# Patient Record
Sex: Female | Born: 1950
Health system: Southern US, Community
[De-identification: ages and names within clinical notes are randomized; demographics above are authoritative.]

## PROBLEM LIST (undated history)

## (undated) DIAGNOSIS — R112 Nausea with vomiting, unspecified: Secondary | ICD-10-CM

## (undated) DIAGNOSIS — J302 Other seasonal allergic rhinitis: Secondary | ICD-10-CM

## (undated) DIAGNOSIS — Z9889 Other specified postprocedural states: Secondary | ICD-10-CM

## (undated) DIAGNOSIS — N950 Postmenopausal bleeding: Secondary | ICD-10-CM

## (undated) DIAGNOSIS — E039 Hypothyroidism, unspecified: Secondary | ICD-10-CM

## (undated) DIAGNOSIS — R011 Cardiac murmur, unspecified: Secondary | ICD-10-CM

## (undated) DIAGNOSIS — E785 Hyperlipidemia, unspecified: Secondary | ICD-10-CM

## (undated) DIAGNOSIS — R600 Localized edema: Secondary | ICD-10-CM

## (undated) DIAGNOSIS — M1990 Unspecified osteoarthritis, unspecified site: Secondary | ICD-10-CM

## (undated) DIAGNOSIS — K219 Gastro-esophageal reflux disease without esophagitis: Secondary | ICD-10-CM

## (undated) DIAGNOSIS — I209 Angina pectoris, unspecified: Secondary | ICD-10-CM

## (undated) DIAGNOSIS — C439 Malignant melanoma of skin, unspecified: Secondary | ICD-10-CM

## (undated) HISTORY — DX: Gastro-esophageal reflux disease without esophagitis: K21.9

## (undated) HISTORY — DX: Hyperlipidemia, unspecified: E78.5

## (undated) HISTORY — DX: Hypothyroidism, unspecified: E03.9

## (undated) HISTORY — DX: Angina pectoris, unspecified: I20.9

## (undated) HISTORY — PX: COLONOSCOPY: SHX174

## (undated) HISTORY — DX: Other seasonal allergic rhinitis: J30.2

## (undated) HISTORY — DX: Malignant melanoma of skin, unspecified: C43.9

---

## 1973-12-30 HISTORY — PX: OTHER SURGICAL HISTORY: SHX169

## 1995-12-31 HISTORY — PX: BREAST LUMPECTOMY: SHX2

## 1997-12-30 DIAGNOSIS — C439 Malignant melanoma of skin, unspecified: Secondary | ICD-10-CM

## 1997-12-30 HISTORY — DX: Malignant melanoma of skin, unspecified: C43.9

## 1998-08-14 ENCOUNTER — Other Ambulatory Visit: Admission: RE | Admit: 1998-08-14 | Discharge: 1998-08-14 | Payer: Self-pay | Admitting: Obstetrics & Gynecology

## 1998-09-14 ENCOUNTER — Other Ambulatory Visit: Admission: RE | Admit: 1998-09-14 | Discharge: 1998-09-14 | Payer: Self-pay | Admitting: Obstetrics & Gynecology

## 1999-03-23 ENCOUNTER — Other Ambulatory Visit: Admission: RE | Admit: 1999-03-23 | Discharge: 1999-03-23 | Payer: Self-pay | Admitting: Obstetrics & Gynecology

## 1999-10-04 ENCOUNTER — Other Ambulatory Visit: Admission: RE | Admit: 1999-10-04 | Discharge: 1999-10-04 | Payer: Self-pay | Admitting: Obstetrics & Gynecology

## 2000-10-23 ENCOUNTER — Other Ambulatory Visit: Admission: RE | Admit: 2000-10-23 | Discharge: 2000-10-23 | Payer: Self-pay | Admitting: Obstetrics & Gynecology

## 2001-10-28 ENCOUNTER — Other Ambulatory Visit: Admission: RE | Admit: 2001-10-28 | Discharge: 2001-10-28 | Payer: Self-pay | Admitting: Obstetrics & Gynecology

## 2002-10-29 ENCOUNTER — Other Ambulatory Visit: Admission: RE | Admit: 2002-10-29 | Discharge: 2002-10-29 | Payer: Self-pay | Admitting: Obstetrics and Gynecology

## 2003-11-29 ENCOUNTER — Other Ambulatory Visit: Admission: RE | Admit: 2003-11-29 | Discharge: 2003-11-29 | Payer: Self-pay | Admitting: Obstetrics & Gynecology

## 2005-01-04 ENCOUNTER — Other Ambulatory Visit: Admission: RE | Admit: 2005-01-04 | Discharge: 2005-01-04 | Payer: Self-pay | Admitting: Obstetrics & Gynecology

## 2005-12-30 HISTORY — PX: COLONOSCOPY: SHX174

## 2006-01-14 ENCOUNTER — Other Ambulatory Visit: Admission: RE | Admit: 2006-01-14 | Discharge: 2006-01-14 | Payer: Self-pay | Admitting: Obstetrics & Gynecology

## 2006-12-05 ENCOUNTER — Ambulatory Visit: Payer: Self-pay | Admitting: Gastroenterology

## 2006-12-15 ENCOUNTER — Ambulatory Visit: Payer: Self-pay | Admitting: Gastroenterology

## 2006-12-15 ENCOUNTER — Encounter (INDEPENDENT_AMBULATORY_CARE_PROVIDER_SITE_OTHER): Payer: Self-pay | Admitting: *Deleted

## 2007-09-28 ENCOUNTER — Encounter: Admission: RE | Admit: 2007-09-28 | Discharge: 2007-09-28 | Payer: Self-pay | Admitting: Orthopaedic Surgery

## 2010-05-31 ENCOUNTER — Other Ambulatory Visit: Admission: RE | Admit: 2010-05-31 | Discharge: 2010-05-31 | Payer: Self-pay | Admitting: Radiology

## 2011-05-03 ENCOUNTER — Other Ambulatory Visit (HOSPITAL_COMMUNITY): Payer: Self-pay | Admitting: Orthopedic Surgery

## 2011-05-03 DIAGNOSIS — E041 Nontoxic single thyroid nodule: Secondary | ICD-10-CM

## 2011-05-07 ENCOUNTER — Ambulatory Visit (HOSPITAL_COMMUNITY)
Admission: RE | Admit: 2011-05-07 | Discharge: 2011-05-07 | Disposition: A | Discharge status: Home / Self Care | Payer: PRIVATE HEALTH INSURANCE | Source: Ambulatory Visit | Attending: Orthopedic Surgery | Admitting: Orthopedic Surgery

## 2011-05-07 DIAGNOSIS — E049 Nontoxic goiter, unspecified: Secondary | ICD-10-CM | POA: Insufficient documentation

## 2011-05-07 DIAGNOSIS — E041 Nontoxic single thyroid nodule: Secondary | ICD-10-CM

## 2011-05-14 ENCOUNTER — Encounter (INDEPENDENT_AMBULATORY_CARE_PROVIDER_SITE_OTHER): Payer: Self-pay | Admitting: Surgery

## 2011-05-31 ENCOUNTER — Other Ambulatory Visit (HOSPITAL_COMMUNITY): Payer: Self-pay | Admitting: Surgery

## 2011-05-31 DIAGNOSIS — E041 Nontoxic single thyroid nodule: Secondary | ICD-10-CM

## 2011-06-05 ENCOUNTER — Other Ambulatory Visit: Payer: Self-pay | Admitting: Interventional Radiology

## 2011-06-05 ENCOUNTER — Ambulatory Visit (HOSPITAL_COMMUNITY)
Admission: RE | Admit: 2011-06-05 | Discharge: 2011-06-05 | Disposition: A | Discharge status: Home / Self Care | Payer: PRIVATE HEALTH INSURANCE | Source: Ambulatory Visit | Attending: Surgery | Admitting: Surgery

## 2011-06-05 DIAGNOSIS — E041 Nontoxic single thyroid nodule: Secondary | ICD-10-CM

## 2011-06-05 DIAGNOSIS — E042 Nontoxic multinodular goiter: Secondary | ICD-10-CM | POA: Insufficient documentation

## 2011-11-04 ENCOUNTER — Other Ambulatory Visit (HOSPITAL_COMMUNITY): Payer: Self-pay | Admitting: Endocrinology

## 2011-11-04 DIAGNOSIS — E041 Nontoxic single thyroid nodule: Secondary | ICD-10-CM

## 2011-11-14 ENCOUNTER — Ambulatory Visit (HOSPITAL_COMMUNITY)
Admission: RE | Admit: 2011-11-14 | Discharge: 2011-11-14 | Disposition: A | Discharge status: Home / Self Care | Payer: PRIVATE HEALTH INSURANCE | Source: Ambulatory Visit | Attending: Endocrinology | Admitting: Endocrinology

## 2011-11-14 DIAGNOSIS — E042 Nontoxic multinodular goiter: Secondary | ICD-10-CM | POA: Insufficient documentation

## 2011-11-14 DIAGNOSIS — E041 Nontoxic single thyroid nodule: Secondary | ICD-10-CM

## 2011-11-15 ENCOUNTER — Encounter: Payer: Self-pay | Admitting: Gastroenterology

## 2011-12-04 ENCOUNTER — Ambulatory Visit (AMBULATORY_SURGERY_CENTER): Payer: PRIVATE HEALTH INSURANCE | Admitting: *Deleted

## 2011-12-04 ENCOUNTER — Encounter: Payer: Self-pay | Admitting: Gastroenterology

## 2011-12-04 VITALS — Ht 65.5 in | Wt 176.4 lb

## 2011-12-04 DIAGNOSIS — Z1211 Encounter for screening for malignant neoplasm of colon: Secondary | ICD-10-CM

## 2011-12-04 MED ORDER — PEG-KCL-NACL-NASULF-NA ASC-C 100 G PO SOLR: ORAL | Status: DC

## 2011-12-18 ENCOUNTER — Encounter: Payer: Self-pay | Admitting: Gastroenterology

## 2011-12-18 ENCOUNTER — Ambulatory Visit (AMBULATORY_SURGERY_CENTER): Payer: PRIVATE HEALTH INSURANCE | Admitting: Gastroenterology

## 2011-12-18 VITALS — BP 121/77 | HR 74 | Temp 98.9°F | Resp 20 | Ht 65.0 in | Wt 176.0 lb

## 2011-12-18 DIAGNOSIS — D129 Benign neoplasm of anus and anal canal: Secondary | ICD-10-CM

## 2011-12-18 DIAGNOSIS — Z1211 Encounter for screening for malignant neoplasm of colon: Secondary | ICD-10-CM

## 2011-12-18 DIAGNOSIS — Z860100 Personal history of colon polyps, unspecified: Secondary | ICD-10-CM | POA: Insufficient documentation

## 2011-12-18 DIAGNOSIS — D128 Benign neoplasm of rectum: Secondary | ICD-10-CM

## 2011-12-18 DIAGNOSIS — D126 Benign neoplasm of colon, unspecified: Secondary | ICD-10-CM

## 2011-12-18 MED ORDER — SODIUM CHLORIDE 0.9 % IV SOLN: 500.0000 mL | INTRAVENOUS | Status: DC

## 2011-12-18 NOTE — Progress Notes (Signed)
No complaints voiced in the recovery room. Maw  Patient did not have preoperative order for IV antibiotic SSI prophylaxis. [phone]) Patient did not experience any of the following events: a burn prior to discharge; a fall within the facility; wrong site/side/patient/procedure/implant event; or a hospital transfer or hospital admission upon discharge from the facility. [phone])   Pt requested to go to the restroom before her 30 mins were up.  I offered the bed pan to her.  I firsted cleaned her back side with baby wipes and then she was put on the bedpan.  No stool was present, just tan colored liquid.  MAW

## 2011-12-18 NOTE — Op Note (Signed)
[facility] [address] [place], [place]  [address]  COLONOSCOPY PROCEDURE REPORT  PATIENT:  Debra Patterson, Debra Patterson  MR#:  [id number] BIRTHDATE:  15-Feb-1951, 60 yrs. old  GENDER:  female ENDOSCOPIST:  Vania Rea. Jarold Motto, MD, [facility] REF. BY: PROCEDURE DATE:  12/18/2011 PROCEDURE:  Colonoscopy with biopsy ASA CLASS:  Class II INDICATIONS:  history of pre-cancerous (adenomatous) colon polyps  MEDICATIONS:   propofol (Diprivan) 280 mg IV  DESCRIPTION OF PROCEDURE:   After the risks and benefits and of the procedure were explained, informed consent was obtained. Digital rectal exam was performed and revealed no abnormalities. The LB 180AL [id number] endoscope was introduced through the anus and advanced to the cecum, which was identified by both the appendix and ileocecal valve.  The quality of the prep was excellent, using MoviPrep.  The instrument was then slowly withdrawn as the colon was fully examined. <<PROCEDUREIMAGES>>  FINDINGS:  A sessile polyp was found in the rectum. RECTAL POLYP COLD BIOPSY REMOVED  This was otherwise a normal examination of the colon.   Retroflexed views in the rectum revealed no abnormalities.    The scope was then withdrawn from the patient and the procedure completed.  COMPLICATIONS:  None ENDOSCOPIC IMPRESSION: 1) Sessile polyp in the rectum 2) Otherwise normal examination R/O ADENOMA. RECOMMENDATIONS: 1) Repeat colonoscopy in 5 years if polyp adenomatous; otherwise 10 years  REPEAT EXAM:  No  ______________________________ Vania Rea. Jarold Motto, MD, [facility]  CC:  Adrian Prince, MD  n. Rosalie DoctorMarland Kitchen   Vania Rea. Nicol Herbig at 12/18/2011 10:52 AM  Vara Guardian, [phone]

## 2011-12-18 NOTE — Patient Instructions (Signed)
See the picture page for your findings from your exam today.  Follow the green and blue discharge instruction sheets the rest of the day.  Resume your prior medications today. Please call if any questions or concerns.  

## 2011-12-19 ENCOUNTER — Telehealth: Payer: Self-pay

## 2011-12-19 NOTE — Telephone Encounter (Signed)

## 2012-01-01 ENCOUNTER — Encounter: Payer: Self-pay | Admitting: Gastroenterology

## 2012-06-30 ENCOUNTER — Other Ambulatory Visit: Payer: Self-pay | Admitting: Obstetrics & Gynecology

## 2012-07-09 ENCOUNTER — Encounter (INDEPENDENT_AMBULATORY_CARE_PROVIDER_SITE_OTHER): Payer: Self-pay

## 2014-03-23 ENCOUNTER — Other Ambulatory Visit (HOSPITAL_COMMUNITY): Payer: Self-pay | Admitting: Endocrinology

## 2014-03-23 DIAGNOSIS — E049 Nontoxic goiter, unspecified: Secondary | ICD-10-CM

## 2014-03-25 ENCOUNTER — Ambulatory Visit (HOSPITAL_COMMUNITY)
Admission: RE | Admit: 2014-03-25 | Discharge: 2014-03-25 | Disposition: A | Discharge status: Home / Self Care | Payer: PRIVATE HEALTH INSURANCE | Source: Ambulatory Visit | Attending: Endocrinology | Admitting: Endocrinology

## 2014-03-25 DIAGNOSIS — E049 Nontoxic goiter, unspecified: Secondary | ICD-10-CM

## 2014-03-25 DIAGNOSIS — E042 Nontoxic multinodular goiter: Secondary | ICD-10-CM | POA: Insufficient documentation

## 2014-09-30 ENCOUNTER — Encounter: Payer: Self-pay | Admitting: Gastroenterology

## 2015-03-13 ENCOUNTER — Other Ambulatory Visit: Payer: Self-pay | Admitting: Obstetrics & Gynecology

## 2015-03-13 DIAGNOSIS — E2839 Other primary ovarian failure: Secondary | ICD-10-CM

## 2015-03-14 LAB — CYTOLOGY - PAP

## 2015-03-27 ENCOUNTER — Ambulatory Visit
Admission: RE | Admit: 2015-03-27 | Discharge: 2015-03-27 | Disposition: A | Discharge status: Home / Self Care | Payer: PPO | Source: Ambulatory Visit | Attending: Obstetrics & Gynecology | Admitting: Obstetrics & Gynecology

## 2015-03-27 DIAGNOSIS — E2839 Other primary ovarian failure: Secondary | ICD-10-CM

## 2015-03-28 ENCOUNTER — Other Ambulatory Visit: Payer: PRIVATE HEALTH INSURANCE

## 2015-09-11 ENCOUNTER — Ambulatory Visit (INDEPENDENT_AMBULATORY_CARE_PROVIDER_SITE_OTHER): Payer: PPO | Admitting: Podiatry

## 2015-09-11 ENCOUNTER — Ambulatory Visit (INDEPENDENT_AMBULATORY_CARE_PROVIDER_SITE_OTHER): Payer: PPO

## 2015-09-11 ENCOUNTER — Ambulatory Visit: Payer: Self-pay

## 2015-09-11 ENCOUNTER — Encounter: Payer: Self-pay | Admitting: Podiatry

## 2015-09-11 VITALS — BP 128/71 | HR 64 | Temp 99.3°F | Resp 14

## 2015-09-11 DIAGNOSIS — R52 Pain, unspecified: Secondary | ICD-10-CM

## 2015-09-11 DIAGNOSIS — M7730 Calcaneal spur, unspecified foot: Secondary | ICD-10-CM

## 2015-09-11 DIAGNOSIS — M722 Plantar fascial fibromatosis: Secondary | ICD-10-CM

## 2015-09-11 NOTE — Progress Notes (Signed)
   Subjective:    Patient ID: Debra Patterson, female    DOB: [birth year], 64 y.o.   MRN: [id number]  HPI Patient presents here today with right back of heel pain which has a knot there for several weeks.  This patient presents to office with painful knot on the back of her right heel.  She says there is pain wearing shoes and walking.  She denies trauma or injury.  There is hard mass noted on the back of the heel bone at insertion achilles tendon right foot.  Patient has history of plantar fascitis which was successfully treated with injection therapy.  She says she cannot take NSAIDs or cortisone by mouth.  She desires evaluation and treatment.  Review of Systems  All other systems reviewed and are negative.      Objective:   Physical Exam GENERAL APPEARANCE: Alert, conversant. Appropriately groomed. No acute distress.  VASCULAR: Pedal pulses palpable at  [facility] and PT bilateral.  Capillary refill time is immediate to all digits,  Normal temperature gradient.  Digital hair growth is present bilateral  NEUROLOGIC: sensation is normal to 5.07 monofilament at 5/5 sites bilateral.  Light touch is intact bilateral, Muscle strength normal.  MUSCULOSKELETAL: acceptable muscle strength, tone and stability bilateral.  Intrinsic muscluature intact bilateral.  Rectus appearance of foot and digits noted bilateral. There is bony exostosis retrocalcaneally right.  No evidence of redness or swelling or inflammation.  DERMATOLOGIC: skin color, texture, and turgor are within normal limits.  No preulcerative lesions or ulcers  are seen, no interdigital maceration noted.  No open lesions present.  Digital nails are asymptomatic. No drainage noted.        Assessment & Plan:  Bone Spur  Right  IE  X-ray.  Due to her allergies I recommended insole usage.  To check with other treatment since she does not want medicine po or immobilization as form of treatment.  To call her tomorrow with recommendations.

## 2015-09-12 NOTE — Addendum Note (Signed)
Addended by: Ezzard Flax, Merric Yost L on: 09/12/2015 08:35 AM   Modules accepted: Orders

## 2015-09-13 ENCOUNTER — Ambulatory Visit (INDEPENDENT_AMBULATORY_CARE_PROVIDER_SITE_OTHER): Payer: PPO | Admitting: Podiatry

## 2015-09-13 ENCOUNTER — Encounter: Payer: Self-pay | Admitting: Podiatry

## 2015-09-13 DIAGNOSIS — M7730 Calcaneal spur, unspecified foot: Secondary | ICD-10-CM

## 2015-09-13 NOTE — Progress Notes (Addendum)
Patient ID: Debra Patterson, female   DOB: Dec 06, 1951, 64 y.o.   MRN: [id number] IPatient presents here today with right back of heel pain which has a knot there for several weeks.  This patient presents to office with painful knot on the back of her right heel. She says there is pain wearing shoes and walking. She denies trauma or injury. There is hard mass noted on the back of the heel bone at insertion achilles tendon right foot. Patient has history of plantar fascitis which was successfully treated with injection therapy. She says she cannot take NSAIDs or cortisone by mouth. She desires evaluation and treatment.  Review of Systems  All other systems reviewed and are negative am back to see Dr Prudence Davidson to get the injection in my foot  Objective     Physical Exam GENERAL APPEARANCE: Alert, conversant. Appropriately groomed. No acute distress.  VASCULAR: Pedal pulses palpable at [facility] and PT bilateral. Capillary refill time is immediate to all digits, Normal temperature gradient. Digital hair growth is present bilateral  NEUROLOGIC: sensation is normal to 5.07 monofilament at 5/5 sites bilateral. Light touch is intact bilateral, Muscle strength normal.  MUSCULOSKELETAL: acceptable muscle strength, tone and stability bilateral. Intrinsic muscluature intact bilateral. Rectus appearance of foot and digits noted bilateral. There is bony exostosis retrocalcaneally right. No evidence of redness or swelling or inflammation.  DERMATOLOGIC: skin color, texture, and turgor are within normal limits. No preulcerative lesions or ulcers are seen, no interdigital maceration noted. No open lesions present. Digital nails are asymptomatic. No drainage noted.        Assessment & Plan:  Bone Spur Right  Injection therapy.  Told her to discontinue activity until her vacation.  Told her to wear night splint in PM. Call as needed. Injection was directed from lateral position.

## 2015-11-29 ENCOUNTER — Ambulatory Visit (INDEPENDENT_AMBULATORY_CARE_PROVIDER_SITE_OTHER): Payer: PPO | Admitting: Podiatry

## 2015-11-29 ENCOUNTER — Encounter: Payer: Self-pay | Admitting: Podiatry

## 2015-11-29 VITALS — BP 145/78 | HR 75 | Resp 14

## 2015-11-29 DIAGNOSIS — M7730 Calcaneal spur, unspecified foot: Secondary | ICD-10-CM

## 2015-11-29 NOTE — Progress Notes (Signed)
Subjective:     Patient ID: Debra Patterson, female   DOB: 01/30/1951, 64 y.o.   MRN: [id number]  HPI this patient returns to the office with a diagnosis of a retrocalcaneal bursitis, right foot. She states she received an injection by myself approximately 2 months ago. She states she is was pain free until last week when the pain has started to return. She states that there has been an increase in the swelling at the back of the right heel. She points to one area that is the most extreme pain in her heel. She requests continued evaluation and treatment of this condition. 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 Appropriately groomed. No acute distress.  VASCULAR: Pedal pulses palpable at  [facility] and PT bilateral.  Capillary refill time is immediate to all digits,  Normal temperature gradient.  Digital hair growth is present bilateral  NEUROLOGIC: sensation is normal to 5.07 monofilament at 5/5 sites bilateral.  Light touch is intact bilateral, Muscle strength normal.  MUSCULOSKELETAL: acceptable muscle strength, tone and stability bilateral.  Intrinsic muscluature intact bilateral.  Rectus appearance of foot and digits noted bilateral. Pain and redness and increased swelling at the insertion achilles tendon right foot.   DERMATOLOGIC: skin color, texture, and turgor are within normal limits.  No preulcerative lesions or ulcers  are seen, no interdigital maceration noted.  No open lesions present.  Digital nails are asymptomatic. No drainage noted.     Assessment:     Retrocalcaneal exostosis Right     Plan:     Injection therapy.  She was given a second injection directed laterally from achilles tendon insertion.  She is unable to take NSAIDs.  I told her that if this persists, I will no longer provide her with injections but I will  request she be evaluated by surgeons at [place]. RTC prn

## 2016-02-05 DIAGNOSIS — K5900 Constipation, unspecified: Secondary | ICD-10-CM | POA: Diagnosis not present

## 2016-02-05 DIAGNOSIS — R1011 Right upper quadrant pain: Secondary | ICD-10-CM | POA: Diagnosis not present

## 2016-02-05 DIAGNOSIS — K219 Gastro-esophageal reflux disease without esophagitis: Secondary | ICD-10-CM | POA: Diagnosis not present

## 2016-02-13 DIAGNOSIS — N63 Unspecified lump in breast: Secondary | ICD-10-CM | POA: Diagnosis not present

## 2016-02-16 DIAGNOSIS — Z6829 Body mass index (BMI) 29.0-29.9, adult: Secondary | ICD-10-CM | POA: Diagnosis not present

## 2016-02-16 DIAGNOSIS — I10 Essential (primary) hypertension: Secondary | ICD-10-CM | POA: Diagnosis not present

## 2016-02-16 DIAGNOSIS — R05 Cough: Secondary | ICD-10-CM | POA: Diagnosis not present

## 2016-02-16 DIAGNOSIS — R509 Fever, unspecified: Secondary | ICD-10-CM | POA: Diagnosis not present

## 2016-02-16 DIAGNOSIS — J029 Acute pharyngitis, unspecified: Secondary | ICD-10-CM | POA: Diagnosis not present

## 2016-03-25 DIAGNOSIS — E784 Other hyperlipidemia: Secondary | ICD-10-CM | POA: Diagnosis not present

## 2016-03-25 DIAGNOSIS — D126 Benign neoplasm of colon, unspecified: Secondary | ICD-10-CM | POA: Diagnosis not present

## 2016-03-25 DIAGNOSIS — C439 Malignant melanoma of skin, unspecified: Secondary | ICD-10-CM | POA: Diagnosis not present

## 2016-03-25 DIAGNOSIS — E042 Nontoxic multinodular goiter: Secondary | ICD-10-CM | POA: Diagnosis not present

## 2016-03-25 DIAGNOSIS — Z6829 Body mass index (BMI) 29.0-29.9, adult: Secondary | ICD-10-CM | POA: Diagnosis not present

## 2016-03-25 DIAGNOSIS — E559 Vitamin D deficiency, unspecified: Secondary | ICD-10-CM | POA: Diagnosis not present

## 2016-03-25 DIAGNOSIS — Z1389 Encounter for screening for other disorder: Secondary | ICD-10-CM | POA: Diagnosis not present

## 2016-03-25 DIAGNOSIS — I10 Essential (primary) hypertension: Secondary | ICD-10-CM | POA: Diagnosis not present

## 2016-04-01 DIAGNOSIS — Z01419 Encounter for gynecological examination (general) (routine) without abnormal findings: Secondary | ICD-10-CM | POA: Diagnosis not present

## 2016-04-01 DIAGNOSIS — Z6828 Body mass index (BMI) 28.0-28.9, adult: Secondary | ICD-10-CM | POA: Diagnosis not present

## 2016-04-04 DIAGNOSIS — L820 Inflamed seborrheic keratosis: Secondary | ICD-10-CM | POA: Diagnosis not present

## 2016-04-26 DIAGNOSIS — M5412 Radiculopathy, cervical region: Secondary | ICD-10-CM | POA: Diagnosis not present

## 2016-04-30 DIAGNOSIS — M47892 Other spondylosis, cervical region: Secondary | ICD-10-CM | POA: Diagnosis not present

## 2016-04-30 DIAGNOSIS — R208 Other disturbances of skin sensation: Secondary | ICD-10-CM | POA: Diagnosis not present

## 2016-04-30 DIAGNOSIS — M5412 Radiculopathy, cervical region: Secondary | ICD-10-CM | POA: Diagnosis not present

## 2016-05-01 DIAGNOSIS — M5412 Radiculopathy, cervical region: Secondary | ICD-10-CM | POA: Diagnosis not present

## 2016-05-03 DIAGNOSIS — M50323 Other cervical disc degeneration at C6-C7 level: Secondary | ICD-10-CM | POA: Diagnosis not present

## 2016-05-03 DIAGNOSIS — M5030 Other cervical disc degeneration, unspecified cervical region: Secondary | ICD-10-CM | POA: Diagnosis not present

## 2016-05-03 DIAGNOSIS — M50322 Other cervical disc degeneration at C5-C6 level: Secondary | ICD-10-CM | POA: Diagnosis not present

## 2016-05-03 DIAGNOSIS — M5412 Radiculopathy, cervical region: Secondary | ICD-10-CM | POA: Diagnosis not present

## 2016-05-13 DIAGNOSIS — M545 Low back pain: Secondary | ICD-10-CM | POA: Diagnosis not present

## 2016-05-13 DIAGNOSIS — R109 Unspecified abdominal pain: Secondary | ICD-10-CM | POA: Diagnosis not present

## 2016-05-17 DIAGNOSIS — M50322 Other cervical disc degeneration at C5-C6 level: Secondary | ICD-10-CM | POA: Diagnosis not present

## 2016-05-17 DIAGNOSIS — M5412 Radiculopathy, cervical region: Secondary | ICD-10-CM | POA: Diagnosis not present

## 2016-05-17 DIAGNOSIS — M50122 Cervical disc disorder at C5-C6 level with radiculopathy: Secondary | ICD-10-CM | POA: Diagnosis not present

## 2016-06-28 DIAGNOSIS — M5412 Radiculopathy, cervical region: Secondary | ICD-10-CM | POA: Diagnosis not present

## 2016-07-08 DIAGNOSIS — H2513 Age-related nuclear cataract, bilateral: Secondary | ICD-10-CM | POA: Diagnosis not present

## 2016-08-20 DIAGNOSIS — N63 Unspecified lump in breast: Secondary | ICD-10-CM | POA: Diagnosis not present

## 2016-10-01 DIAGNOSIS — M5412 Radiculopathy, cervical region: Secondary | ICD-10-CM | POA: Diagnosis not present

## 2016-10-16 DIAGNOSIS — Z6830 Body mass index (BMI) 30.0-30.9, adult: Secondary | ICD-10-CM | POA: Diagnosis not present

## 2016-10-16 DIAGNOSIS — I10 Essential (primary) hypertension: Secondary | ICD-10-CM | POA: Diagnosis not present

## 2016-10-16 DIAGNOSIS — C439 Malignant melanoma of skin, unspecified: Secondary | ICD-10-CM | POA: Diagnosis not present

## 2016-10-16 DIAGNOSIS — E042 Nontoxic multinodular goiter: Secondary | ICD-10-CM | POA: Diagnosis not present

## 2016-10-16 DIAGNOSIS — D126 Benign neoplasm of colon, unspecified: Secondary | ICD-10-CM | POA: Diagnosis not present

## 2016-10-16 DIAGNOSIS — E784 Other hyperlipidemia: Secondary | ICD-10-CM | POA: Diagnosis not present

## 2016-10-16 DIAGNOSIS — Z23 Encounter for immunization: Secondary | ICD-10-CM | POA: Diagnosis not present

## 2016-10-16 DIAGNOSIS — E559 Vitamin D deficiency, unspecified: Secondary | ICD-10-CM | POA: Diagnosis not present

## 2016-12-12 DIAGNOSIS — Z85828 Personal history of other malignant neoplasm of skin: Secondary | ICD-10-CM | POA: Diagnosis not present

## 2016-12-12 DIAGNOSIS — Z85820 Personal history of malignant melanoma of skin: Secondary | ICD-10-CM | POA: Diagnosis not present

## 2016-12-12 DIAGNOSIS — D2372 Other benign neoplasm of skin of left lower limb, including hip: Secondary | ICD-10-CM | POA: Diagnosis not present

## 2016-12-12 DIAGNOSIS — D1801 Hemangioma of skin and subcutaneous tissue: Secondary | ICD-10-CM | POA: Diagnosis not present

## 2016-12-12 DIAGNOSIS — D485 Neoplasm of uncertain behavior of skin: Secondary | ICD-10-CM | POA: Diagnosis not present

## 2016-12-12 DIAGNOSIS — L814 Other melanin hyperpigmentation: Secondary | ICD-10-CM | POA: Diagnosis not present

## 2016-12-12 DIAGNOSIS — D225 Melanocytic nevi of trunk: Secondary | ICD-10-CM | POA: Diagnosis not present

## 2016-12-12 DIAGNOSIS — D224 Melanocytic nevi of scalp and neck: Secondary | ICD-10-CM | POA: Diagnosis not present

## 2016-12-12 DIAGNOSIS — L738 Other specified follicular disorders: Secondary | ICD-10-CM | POA: Diagnosis not present

## 2016-12-12 DIAGNOSIS — D2271 Melanocytic nevi of right lower limb, including hip: Secondary | ICD-10-CM | POA: Diagnosis not present

## 2016-12-12 DIAGNOSIS — L821 Other seborrheic keratosis: Secondary | ICD-10-CM | POA: Diagnosis not present

## 2016-12-26 DIAGNOSIS — D485 Neoplasm of uncertain behavior of skin: Secondary | ICD-10-CM | POA: Diagnosis not present

## 2016-12-26 DIAGNOSIS — Z85828 Personal history of other malignant neoplasm of skin: Secondary | ICD-10-CM | POA: Diagnosis not present

## 2016-12-26 DIAGNOSIS — Z85820 Personal history of malignant melanoma of skin: Secondary | ICD-10-CM | POA: Diagnosis not present

## 2016-12-26 DIAGNOSIS — L98499 Non-pressure chronic ulcer of skin of other sites with unspecified severity: Secondary | ICD-10-CM | POA: Diagnosis not present

## 2017-01-01 DIAGNOSIS — M5412 Radiculopathy, cervical region: Secondary | ICD-10-CM | POA: Diagnosis not present

## 2017-01-07 DIAGNOSIS — Z85820 Personal history of malignant melanoma of skin: Secondary | ICD-10-CM | POA: Diagnosis not present

## 2017-01-07 DIAGNOSIS — L814 Other melanin hyperpigmentation: Secondary | ICD-10-CM | POA: Diagnosis not present

## 2017-01-07 DIAGNOSIS — D485 Neoplasm of uncertain behavior of skin: Secondary | ICD-10-CM | POA: Diagnosis not present

## 2017-01-07 DIAGNOSIS — L738 Other specified follicular disorders: Secondary | ICD-10-CM | POA: Diagnosis not present

## 2017-01-07 DIAGNOSIS — I788 Other diseases of capillaries: Secondary | ICD-10-CM | POA: Diagnosis not present

## 2017-01-07 DIAGNOSIS — Z85828 Personal history of other malignant neoplasm of skin: Secondary | ICD-10-CM | POA: Diagnosis not present

## 2017-01-07 DIAGNOSIS — Z419 Encounter for procedure for purposes other than remedying health state, unspecified: Secondary | ICD-10-CM | POA: Diagnosis not present

## 2017-04-14 DIAGNOSIS — E559 Vitamin D deficiency, unspecified: Secondary | ICD-10-CM | POA: Diagnosis not present

## 2017-04-14 DIAGNOSIS — Z6830 Body mass index (BMI) 30.0-30.9, adult: Secondary | ICD-10-CM | POA: Diagnosis not present

## 2017-04-14 DIAGNOSIS — I10 Essential (primary) hypertension: Secondary | ICD-10-CM | POA: Diagnosis not present

## 2017-04-14 DIAGNOSIS — D126 Benign neoplasm of colon, unspecified: Secondary | ICD-10-CM | POA: Diagnosis not present

## 2017-04-14 DIAGNOSIS — E042 Nontoxic multinodular goiter: Secondary | ICD-10-CM | POA: Diagnosis not present

## 2017-04-14 DIAGNOSIS — K760 Fatty (change of) liver, not elsewhere classified: Secondary | ICD-10-CM | POA: Diagnosis not present

## 2017-04-14 DIAGNOSIS — E784 Other hyperlipidemia: Secondary | ICD-10-CM | POA: Diagnosis not present

## 2017-05-22 DIAGNOSIS — N3090 Cystitis, unspecified without hematuria: Secondary | ICD-10-CM | POA: Diagnosis not present

## 2017-05-22 DIAGNOSIS — N3001 Acute cystitis with hematuria: Secondary | ICD-10-CM | POA: Diagnosis not present

## 2017-05-22 DIAGNOSIS — R300 Dysuria: Secondary | ICD-10-CM | POA: Diagnosis not present

## 2017-06-09 ENCOUNTER — Other Ambulatory Visit: Payer: Self-pay | Admitting: Obstetrics & Gynecology

## 2017-06-09 DIAGNOSIS — Z6830 Body mass index (BMI) 30.0-30.9, adult: Secondary | ICD-10-CM | POA: Diagnosis not present

## 2017-06-09 DIAGNOSIS — Z01411 Encounter for gynecological examination (general) (routine) with abnormal findings: Secondary | ICD-10-CM | POA: Diagnosis not present

## 2017-06-09 DIAGNOSIS — N840 Polyp of corpus uteri: Secondary | ICD-10-CM | POA: Diagnosis not present

## 2017-06-09 DIAGNOSIS — Z124 Encounter for screening for malignant neoplasm of cervix: Secondary | ICD-10-CM | POA: Diagnosis not present

## 2017-06-09 DIAGNOSIS — N950 Postmenopausal bleeding: Secondary | ICD-10-CM | POA: Diagnosis not present

## 2017-06-13 LAB — CYTOLOGY - PAP

## 2017-07-07 DIAGNOSIS — H2513 Age-related nuclear cataract, bilateral: Secondary | ICD-10-CM | POA: Diagnosis not present

## 2017-08-21 DIAGNOSIS — Z1231 Encounter for screening mammogram for malignant neoplasm of breast: Secondary | ICD-10-CM | POA: Diagnosis not present

## 2017-08-21 DIAGNOSIS — Z803 Family history of malignant neoplasm of breast: Secondary | ICD-10-CM | POA: Diagnosis not present

## 2017-08-26 DIAGNOSIS — N6002 Solitary cyst of left breast: Secondary | ICD-10-CM | POA: Diagnosis not present

## 2017-09-03 DIAGNOSIS — K760 Fatty (change of) liver, not elsewhere classified: Secondary | ICD-10-CM | POA: Diagnosis not present

## 2017-09-03 DIAGNOSIS — K219 Gastro-esophageal reflux disease without esophagitis: Secondary | ICD-10-CM | POA: Diagnosis not present

## 2017-09-09 DIAGNOSIS — Z85828 Personal history of other malignant neoplasm of skin: Secondary | ICD-10-CM | POA: Diagnosis not present

## 2017-09-09 DIAGNOSIS — M1990 Unspecified osteoarthritis, unspecified site: Secondary | ICD-10-CM | POA: Diagnosis not present

## 2017-09-09 DIAGNOSIS — Z79899 Other long term (current) drug therapy: Secondary | ICD-10-CM | POA: Diagnosis not present

## 2017-09-09 DIAGNOSIS — Z86010 Personal history of colonic polyps: Secondary | ICD-10-CM | POA: Diagnosis not present

## 2017-09-09 DIAGNOSIS — Z1211 Encounter for screening for malignant neoplasm of colon: Secondary | ICD-10-CM | POA: Diagnosis not present

## 2017-09-09 DIAGNOSIS — K449 Diaphragmatic hernia without obstruction or gangrene: Secondary | ICD-10-CM | POA: Diagnosis not present

## 2017-09-09 DIAGNOSIS — K219 Gastro-esophageal reflux disease without esophagitis: Secondary | ICD-10-CM | POA: Diagnosis not present

## 2017-09-16 DIAGNOSIS — L814 Other melanin hyperpigmentation: Secondary | ICD-10-CM | POA: Diagnosis not present

## 2017-09-16 DIAGNOSIS — Z85820 Personal history of malignant melanoma of skin: Secondary | ICD-10-CM | POA: Diagnosis not present

## 2017-09-16 DIAGNOSIS — L821 Other seborrheic keratosis: Secondary | ICD-10-CM | POA: Diagnosis not present

## 2017-09-16 DIAGNOSIS — D1801 Hemangioma of skin and subcutaneous tissue: Secondary | ICD-10-CM | POA: Diagnosis not present

## 2017-09-16 DIAGNOSIS — L820 Inflamed seborrheic keratosis: Secondary | ICD-10-CM | POA: Diagnosis not present

## 2017-09-16 DIAGNOSIS — Z85828 Personal history of other malignant neoplasm of skin: Secondary | ICD-10-CM | POA: Diagnosis not present

## 2017-10-20 DIAGNOSIS — Z6829 Body mass index (BMI) 29.0-29.9, adult: Secondary | ICD-10-CM | POA: Diagnosis not present

## 2017-10-20 DIAGNOSIS — K760 Fatty (change of) liver, not elsewhere classified: Secondary | ICD-10-CM | POA: Diagnosis not present

## 2017-10-20 DIAGNOSIS — E559 Vitamin D deficiency, unspecified: Secondary | ICD-10-CM | POA: Diagnosis not present

## 2017-10-20 DIAGNOSIS — D126 Benign neoplasm of colon, unspecified: Secondary | ICD-10-CM | POA: Diagnosis not present

## 2017-10-20 DIAGNOSIS — C4362 Malignant melanoma of left upper limb, including shoulder: Secondary | ICD-10-CM | POA: Diagnosis not present

## 2017-10-20 DIAGNOSIS — E7849 Other hyperlipidemia: Secondary | ICD-10-CM | POA: Diagnosis not present

## 2017-10-20 DIAGNOSIS — I10 Essential (primary) hypertension: Secondary | ICD-10-CM | POA: Diagnosis not present

## 2017-10-20 DIAGNOSIS — Z1389 Encounter for screening for other disorder: Secondary | ICD-10-CM | POA: Diagnosis not present

## 2017-10-20 DIAGNOSIS — Z0000 Encounter for general adult medical examination without abnormal findings: Secondary | ICD-10-CM | POA: Diagnosis not present

## 2017-10-20 DIAGNOSIS — E042 Nontoxic multinodular goiter: Secondary | ICD-10-CM | POA: Diagnosis not present

## 2018-03-31 DIAGNOSIS — L814 Other melanin hyperpigmentation: Secondary | ICD-10-CM | POA: Diagnosis not present

## 2018-03-31 DIAGNOSIS — Z85820 Personal history of malignant melanoma of skin: Secondary | ICD-10-CM | POA: Diagnosis not present

## 2018-03-31 DIAGNOSIS — L298 Other pruritus: Secondary | ICD-10-CM | POA: Diagnosis not present

## 2018-03-31 DIAGNOSIS — Z85828 Personal history of other malignant neoplasm of skin: Secondary | ICD-10-CM | POA: Diagnosis not present

## 2018-03-31 DIAGNOSIS — L821 Other seborrheic keratosis: Secondary | ICD-10-CM | POA: Diagnosis not present

## 2018-03-31 DIAGNOSIS — L309 Dermatitis, unspecified: Secondary | ICD-10-CM | POA: Diagnosis not present

## 2018-03-31 DIAGNOSIS — D1801 Hemangioma of skin and subcutaneous tissue: Secondary | ICD-10-CM | POA: Diagnosis not present

## 2018-04-21 DIAGNOSIS — E559 Vitamin D deficiency, unspecified: Secondary | ICD-10-CM | POA: Diagnosis not present

## 2018-04-21 DIAGNOSIS — E7849 Other hyperlipidemia: Secondary | ICD-10-CM | POA: Diagnosis not present

## 2018-04-21 DIAGNOSIS — Z6830 Body mass index (BMI) 30.0-30.9, adult: Secondary | ICD-10-CM | POA: Diagnosis not present

## 2018-04-21 DIAGNOSIS — K760 Fatty (change of) liver, not elsewhere classified: Secondary | ICD-10-CM | POA: Diagnosis not present

## 2018-04-21 DIAGNOSIS — C439 Malignant melanoma of skin, unspecified: Secondary | ICD-10-CM | POA: Diagnosis not present

## 2018-04-21 DIAGNOSIS — D126 Benign neoplasm of colon, unspecified: Secondary | ICD-10-CM | POA: Diagnosis not present

## 2018-04-21 DIAGNOSIS — E042 Nontoxic multinodular goiter: Secondary | ICD-10-CM | POA: Diagnosis not present

## 2018-04-21 DIAGNOSIS — I10 Essential (primary) hypertension: Secondary | ICD-10-CM | POA: Diagnosis not present

## 2018-07-06 DIAGNOSIS — Z01419 Encounter for gynecological examination (general) (routine) without abnormal findings: Secondary | ICD-10-CM | POA: Diagnosis not present

## 2018-07-06 DIAGNOSIS — Z6829 Body mass index (BMI) 29.0-29.9, adult: Secondary | ICD-10-CM | POA: Diagnosis not present

## 2018-07-13 DIAGNOSIS — H2513 Age-related nuclear cataract, bilateral: Secondary | ICD-10-CM | POA: Diagnosis not present

## 2018-08-13 DIAGNOSIS — M5412 Radiculopathy, cervical region: Secondary | ICD-10-CM | POA: Diagnosis not present

## 2018-09-08 DIAGNOSIS — Z803 Family history of malignant neoplasm of breast: Secondary | ICD-10-CM | POA: Diagnosis not present

## 2018-09-08 DIAGNOSIS — Z1231 Encounter for screening mammogram for malignant neoplasm of breast: Secondary | ICD-10-CM | POA: Diagnosis not present

## 2018-09-15 DIAGNOSIS — L309 Dermatitis, unspecified: Secondary | ICD-10-CM | POA: Diagnosis not present

## 2018-09-15 DIAGNOSIS — B078 Other viral warts: Secondary | ICD-10-CM | POA: Diagnosis not present

## 2018-09-15 DIAGNOSIS — Z85820 Personal history of malignant melanoma of skin: Secondary | ICD-10-CM | POA: Diagnosis not present

## 2018-09-15 DIAGNOSIS — L820 Inflamed seborrheic keratosis: Secondary | ICD-10-CM | POA: Diagnosis not present

## 2018-09-15 DIAGNOSIS — D485 Neoplasm of uncertain behavior of skin: Secondary | ICD-10-CM | POA: Diagnosis not present

## 2018-09-15 DIAGNOSIS — Z85828 Personal history of other malignant neoplasm of skin: Secondary | ICD-10-CM | POA: Diagnosis not present

## 2018-10-15 DIAGNOSIS — I10 Essential (primary) hypertension: Secondary | ICD-10-CM | POA: Diagnosis not present

## 2018-10-15 DIAGNOSIS — R82998 Other abnormal findings in urine: Secondary | ICD-10-CM | POA: Diagnosis not present

## 2018-10-15 DIAGNOSIS — E7849 Other hyperlipidemia: Secondary | ICD-10-CM | POA: Diagnosis not present

## 2018-10-15 DIAGNOSIS — E042 Nontoxic multinodular goiter: Secondary | ICD-10-CM | POA: Diagnosis not present

## 2018-10-15 DIAGNOSIS — E559 Vitamin D deficiency, unspecified: Secondary | ICD-10-CM | POA: Diagnosis not present

## 2018-10-21 DIAGNOSIS — Z0000 Encounter for general adult medical examination without abnormal findings: Secondary | ICD-10-CM | POA: Diagnosis not present

## 2018-10-21 DIAGNOSIS — I10 Essential (primary) hypertension: Secondary | ICD-10-CM | POA: Diagnosis not present

## 2018-10-21 DIAGNOSIS — D126 Benign neoplasm of colon, unspecified: Secondary | ICD-10-CM | POA: Diagnosis not present

## 2018-10-21 DIAGNOSIS — E042 Nontoxic multinodular goiter: Secondary | ICD-10-CM | POA: Diagnosis not present

## 2018-10-21 DIAGNOSIS — K760 Fatty (change of) liver, not elsewhere classified: Secondary | ICD-10-CM | POA: Diagnosis not present

## 2018-10-21 DIAGNOSIS — C439 Malignant melanoma of skin, unspecified: Secondary | ICD-10-CM | POA: Diagnosis not present

## 2018-10-21 DIAGNOSIS — Z1389 Encounter for screening for other disorder: Secondary | ICD-10-CM | POA: Diagnosis not present

## 2018-10-21 DIAGNOSIS — E7849 Other hyperlipidemia: Secondary | ICD-10-CM | POA: Diagnosis not present

## 2018-10-21 DIAGNOSIS — Z6831 Body mass index (BMI) 31.0-31.9, adult: Secondary | ICD-10-CM | POA: Diagnosis not present

## 2018-10-21 DIAGNOSIS — E559 Vitamin D deficiency, unspecified: Secondary | ICD-10-CM | POA: Diagnosis not present

## 2019-04-16 DIAGNOSIS — M19041 Primary osteoarthritis, right hand: Secondary | ICD-10-CM | POA: Diagnosis not present

## 2019-04-16 DIAGNOSIS — M5030 Other cervical disc degeneration, unspecified cervical region: Secondary | ICD-10-CM | POA: Diagnosis not present

## 2019-04-16 DIAGNOSIS — M19042 Primary osteoarthritis, left hand: Secondary | ICD-10-CM | POA: Diagnosis not present

## 2019-04-19 DIAGNOSIS — K760 Fatty (change of) liver, not elsewhere classified: Secondary | ICD-10-CM | POA: Diagnosis not present

## 2019-04-19 DIAGNOSIS — E785 Hyperlipidemia, unspecified: Secondary | ICD-10-CM | POA: Diagnosis not present

## 2019-04-19 DIAGNOSIS — C439 Malignant melanoma of skin, unspecified: Secondary | ICD-10-CM | POA: Diagnosis not present

## 2019-04-19 DIAGNOSIS — I10 Essential (primary) hypertension: Secondary | ICD-10-CM | POA: Diagnosis not present

## 2019-04-19 DIAGNOSIS — D126 Benign neoplasm of colon, unspecified: Secondary | ICD-10-CM | POA: Diagnosis not present

## 2019-04-19 DIAGNOSIS — E559 Vitamin D deficiency, unspecified: Secondary | ICD-10-CM | POA: Diagnosis not present

## 2019-04-19 DIAGNOSIS — M19041 Primary osteoarthritis, right hand: Secondary | ICD-10-CM | POA: Diagnosis not present

## 2019-04-19 DIAGNOSIS — E042 Nontoxic multinodular goiter: Secondary | ICD-10-CM | POA: Diagnosis not present

## 2019-05-03 ENCOUNTER — Other Ambulatory Visit: Payer: Self-pay

## 2019-05-03 ENCOUNTER — Ambulatory Visit
Admission: RE | Admit: 2019-05-03 | Discharge: 2019-05-03 | Disposition: A | Discharge status: Home / Self Care | Payer: PPO | Source: Ambulatory Visit | Attending: Adult Health | Admitting: Adult Health

## 2019-05-03 ENCOUNTER — Other Ambulatory Visit: Payer: Self-pay | Admitting: Adult Health

## 2019-05-03 DIAGNOSIS — M79641 Pain in right hand: Secondary | ICD-10-CM | POA: Diagnosis not present

## 2019-05-03 DIAGNOSIS — M25532 Pain in left wrist: Secondary | ICD-10-CM | POA: Diagnosis not present

## 2019-05-03 DIAGNOSIS — M791 Myalgia, unspecified site: Secondary | ICD-10-CM | POA: Diagnosis not present

## 2019-05-03 DIAGNOSIS — M25432 Effusion, left wrist: Secondary | ICD-10-CM

## 2019-05-03 DIAGNOSIS — E042 Nontoxic multinodular goiter: Secondary | ICD-10-CM | POA: Diagnosis not present

## 2019-05-03 DIAGNOSIS — M79642 Pain in left hand: Secondary | ICD-10-CM | POA: Diagnosis not present

## 2019-05-03 DIAGNOSIS — R29898 Other symptoms and signs involving the musculoskeletal system: Secondary | ICD-10-CM

## 2019-05-03 DIAGNOSIS — M25531 Pain in right wrist: Secondary | ICD-10-CM | POA: Diagnosis not present

## 2019-05-03 DIAGNOSIS — E039 Hypothyroidism, unspecified: Secondary | ICD-10-CM | POA: Diagnosis not present

## 2019-05-03 DIAGNOSIS — M67949 Unspecified disorder of synovium and tendon, unspecified hand: Secondary | ICD-10-CM | POA: Diagnosis not present

## 2019-05-03 DIAGNOSIS — E785 Hyperlipidemia, unspecified: Secondary | ICD-10-CM | POA: Diagnosis not present

## 2019-05-03 DIAGNOSIS — M7989 Other specified soft tissue disorders: Secondary | ICD-10-CM

## 2019-05-03 DIAGNOSIS — M79643 Pain in unspecified hand: Secondary | ICD-10-CM | POA: Diagnosis not present

## 2019-05-03 DIAGNOSIS — M2550 Pain in unspecified joint: Secondary | ICD-10-CM | POA: Diagnosis not present

## 2019-05-11 DIAGNOSIS — M7989 Other specified soft tissue disorders: Secondary | ICD-10-CM | POA: Diagnosis not present

## 2019-05-11 DIAGNOSIS — M2550 Pain in unspecified joint: Secondary | ICD-10-CM | POA: Diagnosis not present

## 2019-05-11 DIAGNOSIS — R5382 Chronic fatigue, unspecified: Secondary | ICD-10-CM | POA: Diagnosis not present

## 2019-05-11 DIAGNOSIS — Z6829 Body mass index (BMI) 29.0-29.9, adult: Secondary | ICD-10-CM | POA: Diagnosis not present

## 2019-05-11 DIAGNOSIS — E663 Overweight: Secondary | ICD-10-CM | POA: Diagnosis not present

## 2019-05-18 DIAGNOSIS — M2550 Pain in unspecified joint: Secondary | ICD-10-CM | POA: Diagnosis not present

## 2019-05-18 DIAGNOSIS — M7989 Other specified soft tissue disorders: Secondary | ICD-10-CM | POA: Diagnosis not present

## 2019-05-18 DIAGNOSIS — R5382 Chronic fatigue, unspecified: Secondary | ICD-10-CM | POA: Diagnosis not present

## 2019-06-21 DIAGNOSIS — Z6829 Body mass index (BMI) 29.0-29.9, adult: Secondary | ICD-10-CM | POA: Diagnosis not present

## 2019-06-21 DIAGNOSIS — M2550 Pain in unspecified joint: Secondary | ICD-10-CM | POA: Diagnosis not present

## 2019-06-21 DIAGNOSIS — M7989 Other specified soft tissue disorders: Secondary | ICD-10-CM | POA: Diagnosis not present

## 2019-06-21 DIAGNOSIS — E663 Overweight: Secondary | ICD-10-CM | POA: Diagnosis not present

## 2019-06-21 DIAGNOSIS — R5382 Chronic fatigue, unspecified: Secondary | ICD-10-CM | POA: Diagnosis not present

## 2019-08-20 DIAGNOSIS — H2513 Age-related nuclear cataract, bilateral: Secondary | ICD-10-CM | POA: Diagnosis not present

## 2019-09-21 DIAGNOSIS — E663 Overweight: Secondary | ICD-10-CM | POA: Diagnosis not present

## 2019-09-21 DIAGNOSIS — M7989 Other specified soft tissue disorders: Secondary | ICD-10-CM | POA: Diagnosis not present

## 2019-09-21 DIAGNOSIS — Z6829 Body mass index (BMI) 29.0-29.9, adult: Secondary | ICD-10-CM | POA: Diagnosis not present

## 2019-09-21 DIAGNOSIS — M2550 Pain in unspecified joint: Secondary | ICD-10-CM | POA: Diagnosis not present

## 2019-09-21 DIAGNOSIS — R5382 Chronic fatigue, unspecified: Secondary | ICD-10-CM | POA: Diagnosis not present

## 2019-09-28 DIAGNOSIS — Z803 Family history of malignant neoplasm of breast: Secondary | ICD-10-CM | POA: Diagnosis not present

## 2019-09-28 DIAGNOSIS — Z1231 Encounter for screening mammogram for malignant neoplasm of breast: Secondary | ICD-10-CM | POA: Diagnosis not present

## 2019-10-08 DIAGNOSIS — M2550 Pain in unspecified joint: Secondary | ICD-10-CM | POA: Diagnosis not present

## 2019-10-08 DIAGNOSIS — R5382 Chronic fatigue, unspecified: Secondary | ICD-10-CM | POA: Diagnosis not present

## 2019-10-08 DIAGNOSIS — M1990 Unspecified osteoarthritis, unspecified site: Secondary | ICD-10-CM | POA: Diagnosis not present

## 2019-10-08 DIAGNOSIS — Z6828 Body mass index (BMI) 28.0-28.9, adult: Secondary | ICD-10-CM | POA: Diagnosis not present

## 2019-10-08 DIAGNOSIS — M7989 Other specified soft tissue disorders: Secondary | ICD-10-CM | POA: Diagnosis not present

## 2019-10-08 DIAGNOSIS — E663 Overweight: Secondary | ICD-10-CM | POA: Diagnosis not present

## 2019-10-22 DIAGNOSIS — E559 Vitamin D deficiency, unspecified: Secondary | ICD-10-CM | POA: Diagnosis not present

## 2019-10-22 DIAGNOSIS — E7849 Other hyperlipidemia: Secondary | ICD-10-CM | POA: Diagnosis not present

## 2019-10-29 DIAGNOSIS — I10 Essential (primary) hypertension: Secondary | ICD-10-CM | POA: Diagnosis not present

## 2019-10-29 DIAGNOSIS — E039 Hypothyroidism, unspecified: Secondary | ICD-10-CM | POA: Diagnosis not present

## 2019-10-29 DIAGNOSIS — Z0000 Encounter for general adult medical examination without abnormal findings: Secondary | ICD-10-CM | POA: Diagnosis not present

## 2019-10-29 DIAGNOSIS — C439 Malignant melanoma of skin, unspecified: Secondary | ICD-10-CM | POA: Diagnosis not present

## 2019-10-29 DIAGNOSIS — Z1339 Encounter for screening examination for other mental health and behavioral disorders: Secondary | ICD-10-CM | POA: Diagnosis not present

## 2019-10-29 DIAGNOSIS — E785 Hyperlipidemia, unspecified: Secondary | ICD-10-CM | POA: Diagnosis not present

## 2019-10-29 DIAGNOSIS — D126 Benign neoplasm of colon, unspecified: Secondary | ICD-10-CM | POA: Diagnosis not present

## 2019-10-29 DIAGNOSIS — E559 Vitamin D deficiency, unspecified: Secondary | ICD-10-CM | POA: Diagnosis not present

## 2019-10-29 DIAGNOSIS — K760 Fatty (change of) liver, not elsewhere classified: Secondary | ICD-10-CM | POA: Diagnosis not present

## 2019-10-29 DIAGNOSIS — Z1331 Encounter for screening for depression: Secondary | ICD-10-CM | POA: Diagnosis not present

## 2019-11-16 DIAGNOSIS — Z124 Encounter for screening for malignant neoplasm of cervix: Secondary | ICD-10-CM | POA: Diagnosis not present

## 2019-11-16 DIAGNOSIS — Z6828 Body mass index (BMI) 28.0-28.9, adult: Secondary | ICD-10-CM | POA: Diagnosis not present

## 2019-11-16 DIAGNOSIS — Z01419 Encounter for gynecological examination (general) (routine) without abnormal findings: Secondary | ICD-10-CM | POA: Diagnosis not present

## 2019-12-08 ENCOUNTER — Other Ambulatory Visit: Payer: Self-pay | Admitting: Obstetrics & Gynecology

## 2019-12-08 ENCOUNTER — Other Ambulatory Visit: Payer: Self-pay | Admitting: Pediatrics

## 2019-12-08 DIAGNOSIS — N950 Postmenopausal bleeding: Secondary | ICD-10-CM

## 2019-12-16 ENCOUNTER — Ambulatory Visit
Admission: RE | Admit: 2019-12-16 | Discharge: 2019-12-16 | Disposition: A | Discharge status: Home / Self Care | Payer: PPO | Source: Ambulatory Visit | Attending: Obstetrics & Gynecology | Admitting: Obstetrics & Gynecology

## 2019-12-16 ENCOUNTER — Other Ambulatory Visit: Payer: Self-pay

## 2019-12-16 DIAGNOSIS — N950 Postmenopausal bleeding: Secondary | ICD-10-CM

## 2019-12-21 DIAGNOSIS — N840 Polyp of corpus uteri: Secondary | ICD-10-CM | POA: Diagnosis not present

## 2019-12-21 DIAGNOSIS — N859 Noninflammatory disorder of uterus, unspecified: Secondary | ICD-10-CM | POA: Diagnosis not present

## 2019-12-21 DIAGNOSIS — N950 Postmenopausal bleeding: Secondary | ICD-10-CM | POA: Diagnosis not present

## 2020-01-04 DIAGNOSIS — N950 Postmenopausal bleeding: Secondary | ICD-10-CM | POA: Diagnosis not present

## 2020-01-18 IMAGING — US US PELVIS COMPLETE WITH TRANSVAGINAL
1 series · 13 of 25 positions shown · non-contrast
Comparison: None

CLINICAL DATA: Postmenopausal bleeding



[Series 1: us pelvis complete with transvaginal · 0.26mm/px · 13 of 86 slices shown]
[im 1/86]
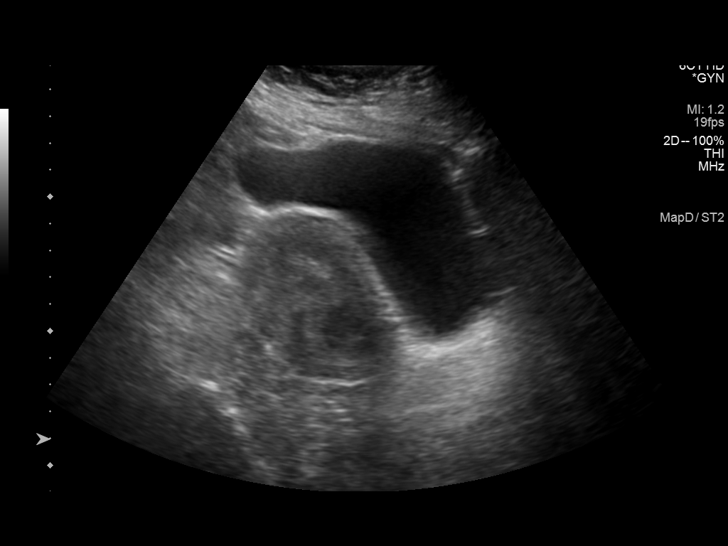
[im 8/86]
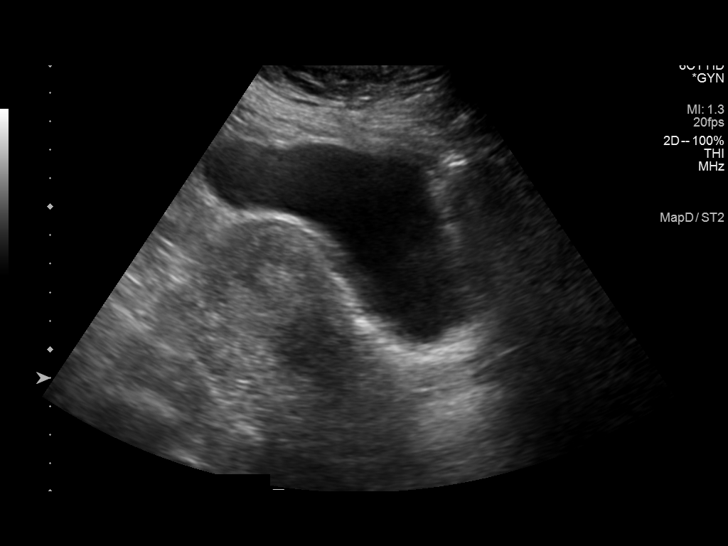
[im 15/86]
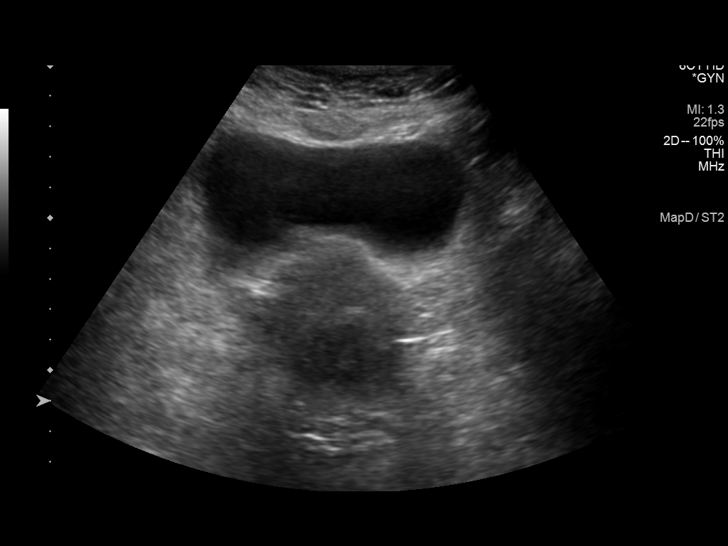
[im 22/86]
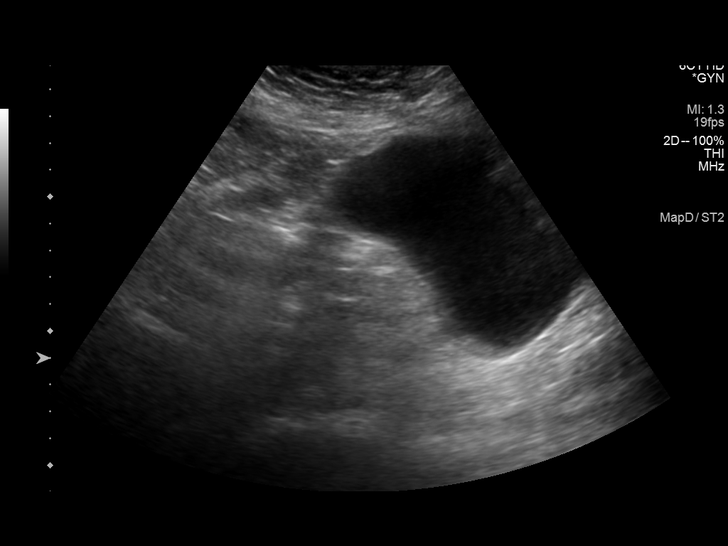
[im 29/86]
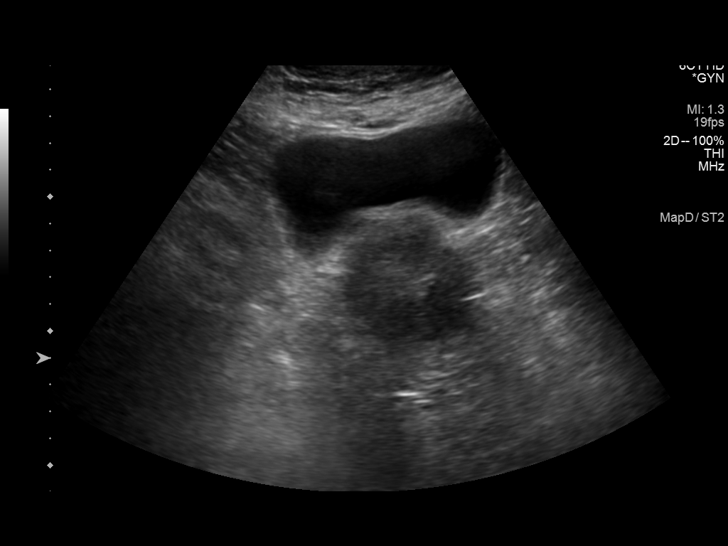
[im 36/86]
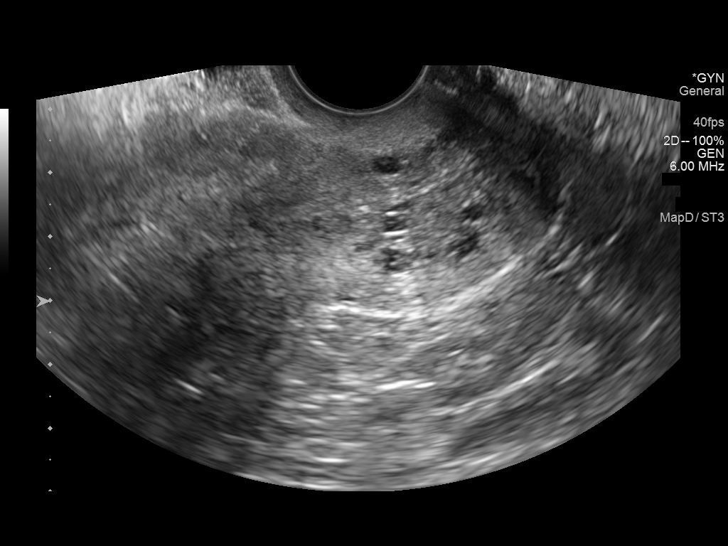
[im 43/86]
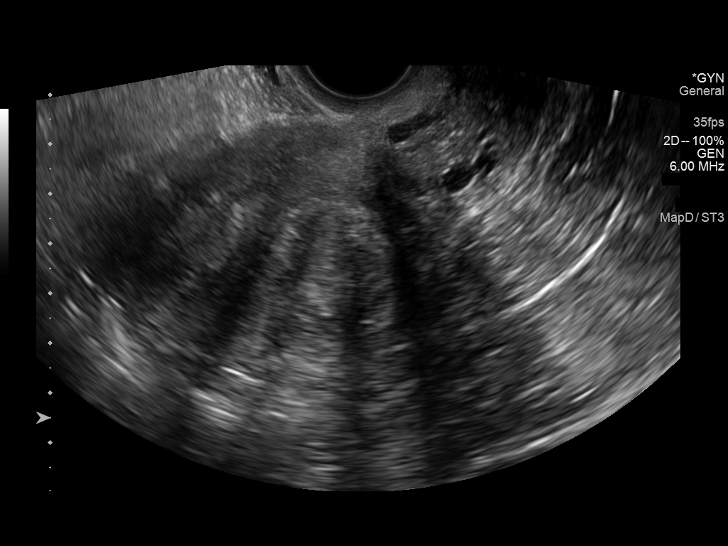
[im 50/86]
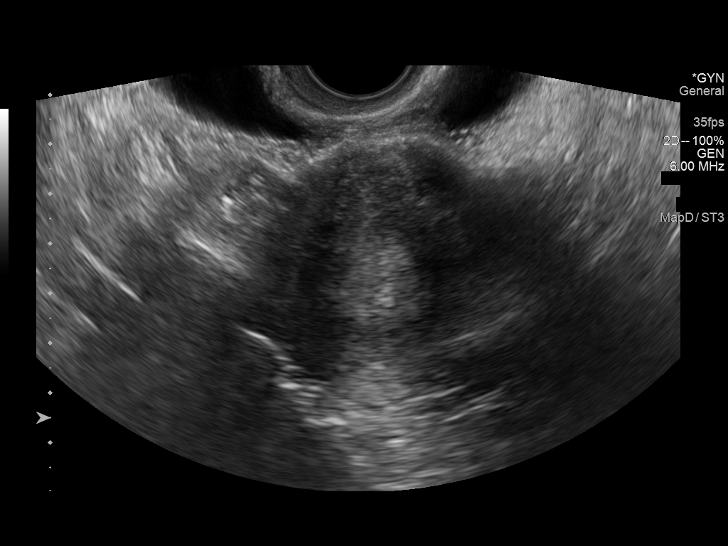
[im 57/86]
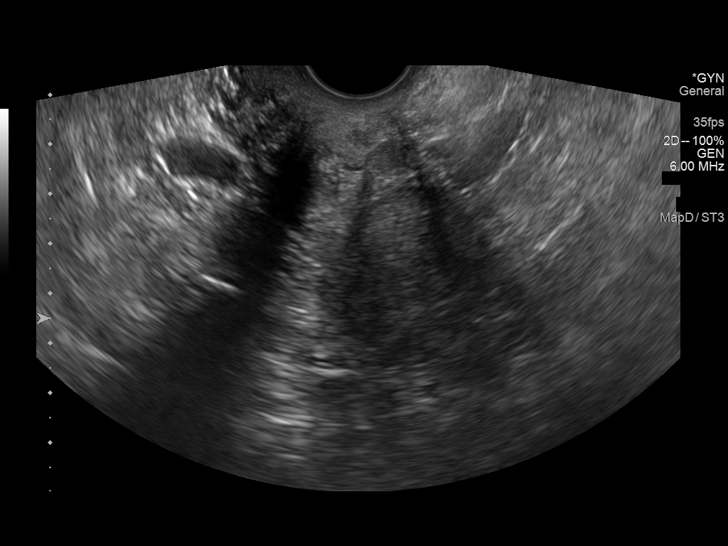
[im 64/86]
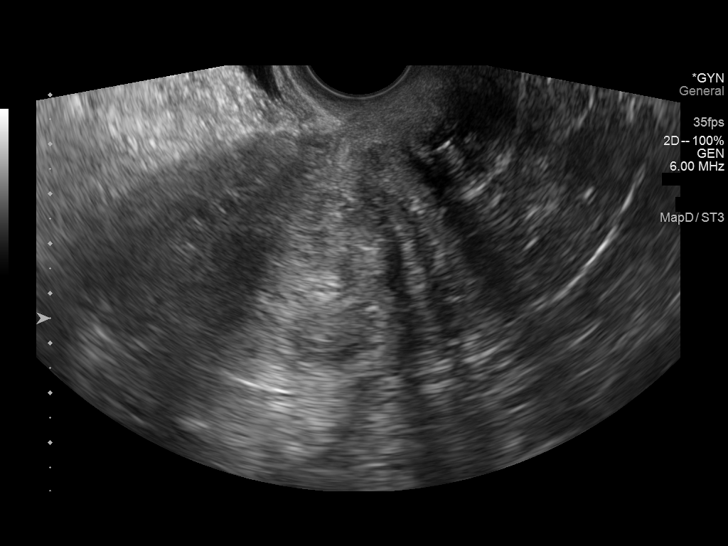
[im 71/86]
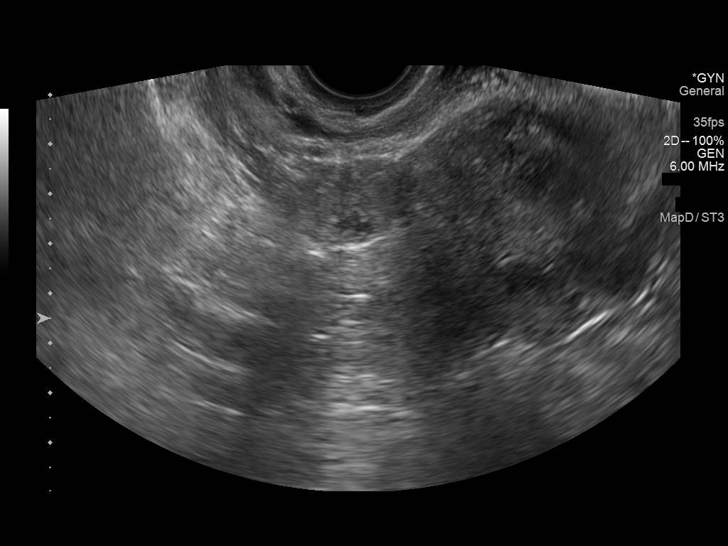
[im 78/86]
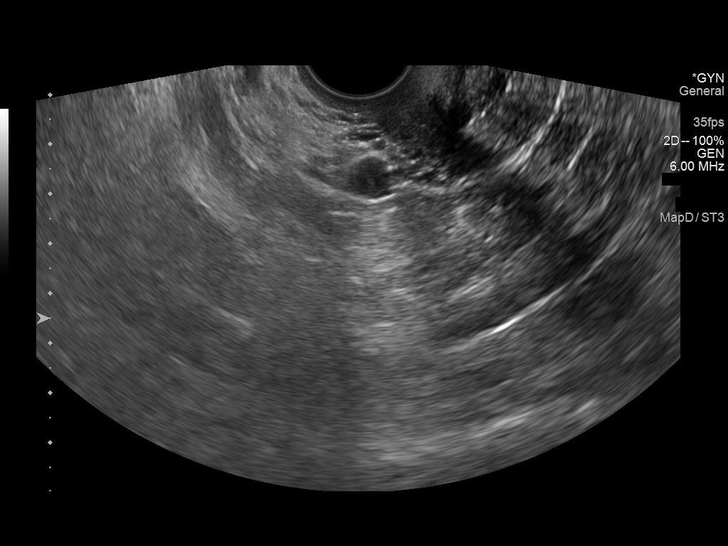
[im 86/86]
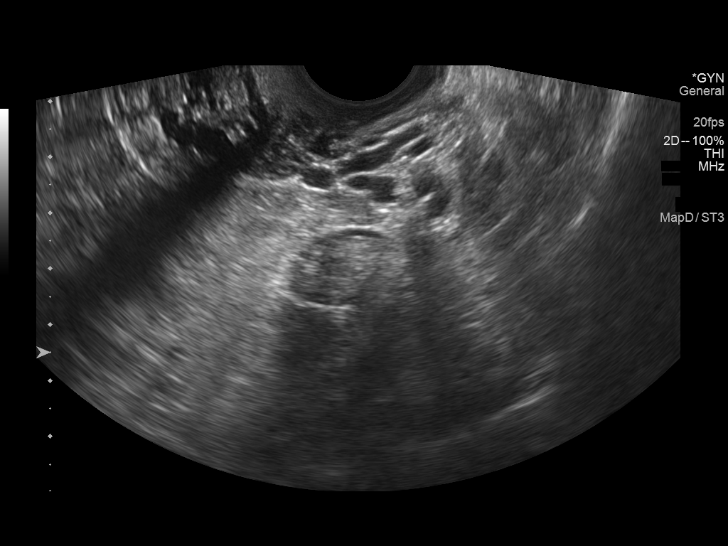

[13 of 25 positions shown; findings below may reference images not displayed]

FINDINGS: Uterus

Measurements: 9.7 x 5.2 x 4.9 cm = volume: 132 mL. Anteverted.
Heterogeneous myometrium. Posterior mid uterine leiomyoma extending
to LEFT, 4.0 x 3.9 x 3.8 cm. No additional masses.

Endometrium

Thickness: 10 mm. Mildly heterogeneous. No endometrial fluid or
focal abnormality.

Right ovary

Not visualized on either transabdominal or endovaginal imaging,
likely obscured by bowel

Left ovary

Not visualized on either transabdominal or endovaginal imaging,
likely obscured by bowel

Other findings

No free pelvic fluid.  No adnexal masses.
IMPRESSION: 10 mm thick endometrial complex, abnormally thickened for a
postmenopausal patient.

In the setting of post-menopausal bleeding, endometrial sampling is
indicated to exclude carcinoma. If results are benign,
sonohysterogram should be considered for focal lesion work-up. (Ref:
Radiological Reasoning: Algorithmic Workup of Abnormal Vaginal
Bleeding with Endovaginal Sonography and Sonohysterography. AJR
[id number]; 191:S68-73)

These results will be called to the ordering clinician or
representative by the Radiologist Assistant, and communication
documented in the PACS or zVision Dashboard.

## 2020-02-15 DIAGNOSIS — N950 Postmenopausal bleeding: Secondary | ICD-10-CM | POA: Diagnosis not present

## 2020-03-14 ENCOUNTER — Other Ambulatory Visit: Payer: Self-pay

## 2020-03-14 ENCOUNTER — Encounter (HOSPITAL_BASED_OUTPATIENT_CLINIC_OR_DEPARTMENT_OTHER): Payer: Self-pay | Admitting: Obstetrics & Gynecology

## 2020-03-14 NOTE — Progress Notes (Addendum)
Spoke w/ via phone for pre-op interview---[facility] needs dos----  Type and screen  , I stat 8          COVID test ------03-17-2020 at 950am  Arrive at -------1030 am 03-20-2020 No food after midnight, clear liquids until 930 am then npo Medications to take morning of surgery -----synthroid,  omeprazole Diabetic medication -----n/a Patient Special Instructions ----- Pre-Op special Istructions ----- Patient verbalized understanding of instructions that were given at this phone interview. Patient denies shortness of breath, chest pain, fever, cough a this phone interview.

## 2020-03-16 ENCOUNTER — Other Ambulatory Visit (HOSPITAL_COMMUNITY): Payer: PPO

## 2020-03-17 ENCOUNTER — Other Ambulatory Visit (HOSPITAL_COMMUNITY)
Admission: RE | Admit: 2020-03-17 | Discharge: 2020-03-17 | Disposition: A | Discharge status: Home / Self Care | Payer: PPO | Source: Ambulatory Visit | Attending: Obstetrics & Gynecology | Admitting: Obstetrics & Gynecology

## 2020-03-17 DIAGNOSIS — Z20822 Contact with and (suspected) exposure to covid-19: Secondary | ICD-10-CM | POA: Insufficient documentation

## 2020-03-17 DIAGNOSIS — Z01812 Encounter for preprocedural laboratory examination: Secondary | ICD-10-CM | POA: Diagnosis not present

## 2020-03-17 LAB — SARS CORONAVIRUS 2 (TAT 6-24 HRS): SARS Coronavirus 2: NEGATIVE

## 2020-03-18 NOTE — Anesthesia Preprocedure Evaluation (Addendum)
Anesthesia Evaluation  Patient identified by MRN, date of birth, ID band Patient awake    Reviewed: Allergy & Precautions, Patient's Chart, lab work & pertinent test results  History of Anesthesia Complications (+) PONV and history of anesthetic complications  Airway Mallampati: II  TM Distance: >3 FB Neck ROM: Full    Dental no notable dental hx.    Pulmonary neg pulmonary ROS,    Pulmonary exam normal        Cardiovascular negative cardio ROS Normal cardiovascular exam     Neuro/Psych negative neurological ROS  negative psych ROS   GI/Hepatic Neg liver ROS, GERD  Controlled and Medicated,  Endo/Other  Hypothyroidism   Renal/GU negative Renal ROS  negative genitourinary   Musculoskeletal  (+) Arthritis ,   Abdominal   Peds  Hematology negative hematology ROS (+)   Anesthesia Other Findings Day of surgery medications reviewed with patient.  Reproductive/Obstetrics Postmenopausal bleeding                            Anesthesia Physical Anesthesia Plan  ASA: II  Anesthesia Plan: General   Post-op Pain Management:    Induction: Intravenous  PONV Risk Score and Plan: 4 or greater and Propofol infusion, Ondansetron, Dexamethasone, Treatment may vary due to age or medical condition and Midazolam  Airway Management Planned: LMA  Additional Equipment: None  Intra-op Plan:   Post-operative Plan: Extubation in OR  Informed Consent: I have reviewed the patients History and Physical, chart, labs and discussed the procedure including the risks, benefits and alternatives for the proposed anesthesia with the patient or authorized representative who has indicated his/her understanding and acceptance.     Dental advisory given  Plan Discussed with: CRNA  Anesthesia Plan Comments:        Anesthesia Quick Evaluation

## 2020-03-20 ENCOUNTER — Encounter (HOSPITAL_BASED_OUTPATIENT_CLINIC_OR_DEPARTMENT_OTHER): Payer: Self-pay | Admitting: Obstetrics & Gynecology

## 2020-03-20 ENCOUNTER — Ambulatory Visit (HOSPITAL_BASED_OUTPATIENT_CLINIC_OR_DEPARTMENT_OTHER)
Admission: RE | Admit: 2020-03-20 | Discharge: 2020-03-20 | Disposition: A | Discharge status: Home / Self Care | Payer: PPO | Attending: Obstetrics & Gynecology | Admitting: Obstetrics & Gynecology

## 2020-03-20 ENCOUNTER — Ambulatory Visit (HOSPITAL_BASED_OUTPATIENT_CLINIC_OR_DEPARTMENT_OTHER): Payer: PPO | Admitting: Anesthesiology

## 2020-03-20 ENCOUNTER — Encounter (HOSPITAL_BASED_OUTPATIENT_CLINIC_OR_DEPARTMENT_OTHER)
Admission: RE | Disposition: A | Discharge status: Home / Self Care | Payer: Self-pay | Source: Home / Self Care | Attending: Obstetrics & Gynecology

## 2020-03-20 DIAGNOSIS — Z79890 Hormone replacement therapy (postmenopausal): Secondary | ICD-10-CM | POA: Insufficient documentation

## 2020-03-20 DIAGNOSIS — Z791 Long term (current) use of non-steroidal anti-inflammatories (NSAID): Secondary | ICD-10-CM | POA: Diagnosis not present

## 2020-03-20 DIAGNOSIS — N950 Postmenopausal bleeding: Secondary | ICD-10-CM

## 2020-03-20 DIAGNOSIS — M1990 Unspecified osteoarthritis, unspecified site: Secondary | ICD-10-CM | POA: Diagnosis not present

## 2020-03-20 DIAGNOSIS — N840 Polyp of corpus uteri: Secondary | ICD-10-CM

## 2020-03-20 DIAGNOSIS — E785 Hyperlipidemia, unspecified: Secondary | ICD-10-CM | POA: Insufficient documentation

## 2020-03-20 DIAGNOSIS — N841 Polyp of cervix uteri: Secondary | ICD-10-CM | POA: Diagnosis not present

## 2020-03-20 DIAGNOSIS — D259 Leiomyoma of uterus, unspecified: Secondary | ICD-10-CM | POA: Insufficient documentation

## 2020-03-20 DIAGNOSIS — K219 Gastro-esophageal reflux disease without esophagitis: Secondary | ICD-10-CM | POA: Insufficient documentation

## 2020-03-20 DIAGNOSIS — Z85820 Personal history of malignant melanoma of skin: Secondary | ICD-10-CM | POA: Diagnosis not present

## 2020-03-20 DIAGNOSIS — E039 Hypothyroidism, unspecified: Secondary | ICD-10-CM | POA: Diagnosis not present

## 2020-03-20 DIAGNOSIS — Z79899 Other long term (current) drug therapy: Secondary | ICD-10-CM | POA: Diagnosis not present

## 2020-03-20 DIAGNOSIS — D219 Benign neoplasm of connective and other soft tissue, unspecified: Secondary | ICD-10-CM

## 2020-03-20 HISTORY — DX: Cardiac murmur, unspecified: R01.1

## 2020-03-20 HISTORY — DX: Other specified postprocedural states: Z98.890

## 2020-03-20 HISTORY — DX: Unspecified osteoarthritis, unspecified site: M19.90

## 2020-03-20 HISTORY — DX: Postmenopausal bleeding: N95.0

## 2020-03-20 HISTORY — DX: Nausea with vomiting, unspecified: R11.2

## 2020-03-20 HISTORY — DX: Localized edema: R60.0

## 2020-03-20 HISTORY — PX: DILATATION & CURETTAGE/HYSTEROSCOPY WITH MYOSURE: SHX6511

## 2020-03-20 LAB — POCT I-STAT, CHEM 8
BUN: 13 mg/dL (ref 8–23)
Calcium, Ion: 1.3 mmol/L (ref 1.15–1.40)
Chloride: 103 mmol/L (ref 98–111)
Creatinine, Ser: 0.7 mg/dL (ref 0.44–1.00)
Glucose, Bld: 91 mg/dL (ref 70–99)
HCT: 45 % (ref 36.0–46.0)
Hemoglobin: 15.3 g/dL — ABNORMAL HIGH (ref 12.0–15.0)
Potassium: 3.5 mmol/L (ref 3.5–5.1)
Sodium: 141 mmol/L (ref 135–145)
TCO2: 28 mmol/L (ref 22–32)

## 2020-03-20 LAB — TYPE AND SCREEN
ABO/RH(D): A POS
Antibody Screen: NEGATIVE

## 2020-03-20 LAB — ABO/RH: ABO/RH(D): A POS

## 2020-03-20 SURGERY — DILATATION & CURETTAGE/HYSTEROSCOPY WITH MYOSURE
Anesthesia: General

## 2020-03-20 MED ORDER — OXYCODONE HCL 5 MG/5ML PO SOLN
5.0000 mg | Freq: Once | ORAL | Status: DC | PRN
  Filled 2020-03-20: qty 5

## 2020-03-20 MED ORDER — PROMETHAZINE HCL 25 MG/ML IJ SOLN
6.2500 mg | INTRAMUSCULAR | Status: DC | PRN
  Filled 2020-03-20: qty 1

## 2020-03-20 MED ORDER — MIDAZOLAM HCL 2 MG/2ML IJ SOLN
INTRAMUSCULAR | Status: AC
  Filled 2020-03-20: qty 2

## 2020-03-20 MED ORDER — ENSURE PRE-SURGERY PO LIQD
296.0000 mL | Freq: Once | ORAL | Status: DC
  Filled 2020-03-20: qty 296

## 2020-03-20 MED ORDER — FENTANYL CITRATE (PF) 100 MCG/2ML IJ SOLN
25.0000 ug | INTRAMUSCULAR | Status: DC | PRN
  Filled 2020-03-20: qty 1

## 2020-03-20 MED ORDER — ACETAMINOPHEN 500 MG PO TABS
1000.0000 mg | ORAL_TABLET | Freq: Once | ORAL | Status: AC
  Administered 2020-03-20: 1000 mg via ORAL
  Filled 2020-03-20: qty 2

## 2020-03-20 MED ORDER — ACETAMINOPHEN 500 MG PO TABS
ORAL_TABLET | ORAL | Status: AC
  Filled 2020-03-20: qty 2

## 2020-03-20 MED ORDER — GABAPENTIN 300 MG PO CAPS
ORAL_CAPSULE | ORAL | Status: AC
  Filled 2020-03-20: qty 1

## 2020-03-20 MED ORDER — KETOROLAC TROMETHAMINE 30 MG/ML IJ SOLN
INTRAMUSCULAR | Status: AC
  Filled 2020-03-20: qty 1

## 2020-03-20 MED ORDER — LIDOCAINE HCL (CARDIAC) PF 100 MG/5ML IV SOSY
PREFILLED_SYRINGE | INTRAVENOUS | Status: DC | PRN
  Administered 2020-03-20: 100 mg via INTRAVENOUS

## 2020-03-20 MED ORDER — KETOROLAC TROMETHAMINE 15 MG/ML IJ SOLN
15.0000 mg | INTRAMUSCULAR | Status: AC
  Administered 2020-03-20: 15 mg via INTRAVENOUS
  Filled 2020-03-20: qty 1

## 2020-03-20 MED ORDER — MIDAZOLAM HCL 5 MG/5ML IJ SOLN
INTRAMUSCULAR | Status: DC | PRN
  Administered 2020-03-20: .5 mg via INTRAVENOUS
  Administered 2020-03-20: 1 mg via INTRAVENOUS
  Administered 2020-03-20: .5 mg via INTRAVENOUS

## 2020-03-20 MED ORDER — FENTANYL CITRATE (PF) 100 MCG/2ML IJ SOLN
INTRAMUSCULAR | Status: DC | PRN
  Administered 2020-03-20: 50 ug via INTRAVENOUS
  Administered 2020-03-20 (×2): 25 ug via INTRAVENOUS

## 2020-03-20 MED ORDER — LACTATED RINGERS IV SOLN
INTRAVENOUS | Status: DC
  Filled 2020-03-20: qty 1000

## 2020-03-20 MED ORDER — ONDANSETRON HCL 4 MG/2ML IJ SOLN
INTRAMUSCULAR | Status: DC | PRN
  Administered 2020-03-20: 4 mg via INTRAVENOUS

## 2020-03-20 MED ORDER — OXYCODONE HCL 5 MG PO TABS
5.0000 mg | ORAL_TABLET | Freq: Once | ORAL | Status: DC | PRN
  Filled 2020-03-20: qty 1

## 2020-03-20 MED ORDER — PROPOFOL 10 MG/ML IV BOLUS
INTRAVENOUS | Status: AC
  Filled 2020-03-20: qty 20

## 2020-03-20 MED ORDER — ONDANSETRON HCL 4 MG/2ML IJ SOLN
INTRAMUSCULAR | Status: AC
  Filled 2020-03-20: qty 2

## 2020-03-20 MED ORDER — SODIUM CHLORIDE 0.9 % IR SOLN
Status: DC | PRN
  Administered 2020-03-20 (×2): 3000 mL

## 2020-03-20 MED ORDER — PROPOFOL 10 MG/ML IV BOLUS
INTRAVENOUS | Status: DC | PRN
  Administered 2020-03-20: 40 mg via INTRAVENOUS
  Administered 2020-03-20: 160 mg via INTRAVENOUS

## 2020-03-20 MED ORDER — ACETAMINOPHEN 500 MG PO TABS
1000.0000 mg | ORAL_TABLET | ORAL | Status: DC
  Filled 2020-03-20: qty 2

## 2020-03-20 MED ORDER — ACETAMINOPHEN 325 MG PO TABS: 650.0000 mg | ORAL_TABLET | Freq: Four times a day (QID) | ORAL | 0 refills | Status: AC | PRN

## 2020-03-20 MED ORDER — DEXAMETHASONE SODIUM PHOSPHATE 10 MG/ML IJ SOLN
INTRAMUSCULAR | Status: AC
  Filled 2020-03-20: qty 1

## 2020-03-20 MED ORDER — GABAPENTIN 300 MG PO CAPS
300.0000 mg | ORAL_CAPSULE | ORAL | Status: AC
  Administered 2020-03-20: 11:00:00 300 mg via ORAL
  Filled 2020-03-20: qty 1

## 2020-03-20 MED ORDER — DEXAMETHASONE SODIUM PHOSPHATE 4 MG/ML IJ SOLN
INTRAMUSCULAR | Status: DC | PRN
  Administered 2020-03-20: 5 mg via INTRAVENOUS

## 2020-03-20 MED ORDER — FENTANYL CITRATE (PF) 100 MCG/2ML IJ SOLN
INTRAMUSCULAR | Status: AC
  Filled 2020-03-20: qty 2

## 2020-03-20 MED ORDER — LIDOCAINE 2% (20 MG/ML) 5 ML SYRINGE
INTRAMUSCULAR | Status: AC
  Filled 2020-03-20: qty 5

## 2020-03-20 SURGICAL SUPPLY — 18 items
CATH ROBINSON RED A/P 16FR (CATHETERS) ×3 IMPLANT
DEVICE MYOSURE REACH (MISCELLANEOUS) ×2 IMPLANT
GLOVE BIOGEL PI IND STRL 6 (GLOVE) ×1 IMPLANT
GLOVE BIOGEL PI IND STRL 7.0 (GLOVE) IMPLANT
GLOVE BIOGEL PI IND STRL 7.5 (GLOVE) IMPLANT
GLOVE BIOGEL PI INDICATOR 6 (GLOVE) ×2
GLOVE BIOGEL PI INDICATOR 7.0 (GLOVE) ×2
GLOVE BIOGEL PI INDICATOR 7.5 (GLOVE) ×4
GLOVE ECLIPSE 6.0 STRL STRAW (GLOVE) ×3 IMPLANT
GOWN STRL REUS W/ TWL LRG LVL3 (GOWN DISPOSABLE) ×1 IMPLANT
GOWN STRL REUS W/TWL LRG LVL3 (GOWN DISPOSABLE) ×6
KIT PROCEDURE FLUENT (KITS) ×3 IMPLANT
KIT TURNOVER CYSTO (KITS) ×3 IMPLANT
PACK VAGINAL MINOR WOMEN LF (CUSTOM PROCEDURE TRAY) ×3 IMPLANT
PAD OB MATERNITY 4.3X12.25 (PERSONAL CARE ITEMS) ×3 IMPLANT
SEAL ROD LENS SCOPE MYOSURE (ABLATOR) ×3 IMPLANT
TOWEL OR 17X26 10 PK STRL BLUE (TOWEL DISPOSABLE) ×3 IMPLANT
UNDERPAD 30X36 HEAVY ABSORB (UNDERPADS AND DIAPERS) ×3 IMPLANT

## 2020-03-20 NOTE — Anesthesia Postprocedure Evaluation (Signed)
Anesthesia Post Note  Patient: Debra Patterson  Procedure(s) Performed: HYSTEROSCOPY WITH MYOSURE (N/A )     Patient location during evaluation: PACU Anesthesia Type: General Level of consciousness: awake and alert and oriented Pain management: pain level controlled Vital Signs Assessment: post-procedure vital signs reviewed and stable Respiratory status: spontaneous breathing, nonlabored ventilation and respiratory function stable Cardiovascular status: blood pressure returned to baseline Postop Assessment: no apparent nausea or vomiting Anesthetic complications: no    Last Vitals:  Vitals:   03/20/20 1300 03/20/20 1307  BP: 127/63   Pulse: 64 68  Resp: 13 15  Temp:    SpO2: 98% 98%    Last Pain:  Vitals:   03/20/20 1254  TempSrc:   PainSc: [place]

## 2020-03-20 NOTE — Discharge Instructions (Signed)
Hysteroscopy, Care After This sheet gives you information about how to care for yourself after your procedure. Your health care provider may also give you more specific instructions. If you have problems or questions, contact your health care provider. What can I expect after the procedure? After the procedure, it is common to have:  Cramping.  Bleeding. This can vary from light spotting to menstrual-like bleeding. Follow these instructions at home: Activity  Rest for 1-2 days after the procedure.  Do not douche, use tampons, or have sex for 2 weeks after the procedure, or until your health care provider approves.  Do not drive for 24 hours after the procedure, or for as long as told by your health care provider.  Do not drive, use heavy machinery, or drink alcohol while taking prescription pain medicines. Medicines   Take over-the-counter and prescription medicines only as told by your health care provider.  Do not take aspirin during recovery. It can increase the risk of bleeding. General instructions  Do not take baths, swim, or use a hot tub until your health care provider approves. Take showers instead of baths for 2 weeks, or for as long as told by your health care provider.  To prevent or treat constipation while you are taking prescription pain medicine, your health care provider may recommend that you: ? Drink enough fluid to keep your urine clear or pale yellow. ? Take over-the-counter or prescription medicines. ? Eat foods that are high in fiber, such as fresh fruits and vegetables, whole grains, and beans. ? Limit foods that are high in fat and processed sugars, such as fried and sweet foods.  Keep all follow-up visits as told by your health care provider. This is important. Contact a health care provider if:  You feel dizzy or lightheaded.  You feel nauseous.  You have abnormal vaginal discharge.  You have a rash.  You have pain that does not get better with  medicine.  You have chills. Get help right away if:  You have bleeding that is heavier than a normal menstrual period.  You have a fever.  You have pain or cramps that get worse.  You develop new abdominal pain.  You faint.  You have pain in your shoulders.  You have shortness of breath. Summary  After the procedure, you may have cramping and some vaginal bleeding.  Do not douche, use tampons, or have sex for 2 weeks after the procedure, or until your health care provider approves.  Do not take baths, swim, or use a hot tub until your health care provider approves. Take showers instead of baths for 2 weeks, or for as long as told by your health care provider.  Report any unusual symptoms to your health care provider.  Keep all follow-up visits as told by your health care provider. This is important. This information is not intended to replace advice given to you by your health care provider. Make sure you discuss any questions you have with your health care provider. Document Revised: 11/28/2017 Document Reviewed: 01/14/2017 Elsevier Patient Education  2020 [employer]  Post Anesthesia Home Care Instructions  Activity: Get plenty of rest for the remainder of the day. A responsible individual must stay with you for 24 hours following the procedure.  For the next 24 hours, DO NOT: -Drive a car -Operate machinery -Drink alcoholic beverages -Take any medication unless instructed by your physician -Make any legal decisions or sign important papers.  Meals: Start with liquid foods such   as gelatin or soup. Progress to regular foods as tolerated. Avoid greasy, spicy, heavy foods. If nausea and/or vomiting occur, drink only clear liquids until the nausea and/or vomiting subsides. Call your physician if vomiting continues.  Special Instructions/Symptoms: Your throat may feel dry or sore from the anesthesia or the breathing tube placed in your throat during surgery. If this  causes discomfort, gargle with warm salt water. The discomfort should disappear within 24 hours.  If you had a scopolamine patch placed behind your ear for the management of post- operative nausea and/or vomiting:  1. The medication in the patch is effective for 72 hours, after which it should be removed.  Wrap patch in a tissue and discard in the trash. Wash hands thoroughly with soap and water. 2. You may remove the patch earlier than 72 hours if you experience unpleasant side effects which may include dry mouth, dizziness or visual disturbances. 3. Avoid touching the patch. Wash your hands with soap and water after contact with the patch.     

## 2020-03-20 NOTE — Transfer of Care (Signed)
Immediate Anesthesia Transfer of Care Note  Patient: Debra Patterson  Procedure(s) Performed: HYSTEROSCOPY WITH MYOSURE (N/A )  Patient Location: PACU  Anesthesia Type:General  Level of Consciousness: awake, alert  and oriented  Airway & Oxygen Therapy: Patient Spontanous Breathing and Patient connected to nasal cannula oxygen  Post-op Assessment: Report given to RN and Post -op Vital signs reviewed and stable  Post vital signs: Reviewed and stable  Last Vitals:  Vitals Value Taken Time  BP 140/70 03/20/20 1254  Temp 36.4 C 03/20/20 1254  Pulse 69 03/20/20 1257  Resp 11 03/20/20 1257  SpO2 98 % 03/20/20 1257  Vitals shown include unvalidated device data.  Last Pain:  Vitals:   03/20/20 1055  TempSrc: Oral  PainSc: 0-No pain      Patients Stated Pain Goal: 3 ([id number])  Complications: No apparent anesthesia complications

## 2020-03-20 NOTE — Anesthesia Procedure Notes (Signed)
Procedure Name: LMA Insertion Date/Time: 03/20/2020 12:10 PM Performed by: Bufford Spikes, CRNA Pre-anesthesia Checklist: Patient identified, Emergency Drugs available, Suction available and Patient being monitored Patient Re-evaluated:Patient Re-evaluated prior to induction Oxygen Delivery Method: Circle system utilized Preoxygenation: Pre-oxygenation with 100% oxygen Induction Type: IV induction Ventilation: Mask ventilation without difficulty LMA: LMA inserted LMA Size: 4.0 Number of attempts: 1 Airway Equipment and Method: Bite block Placement Confirmation: positive ETCO2 Tube secured with: Tape Dental Injury: Teeth and Oropharynx as per pre-operative assessment

## 2020-03-20 NOTE — H&P (Signed)
Debra Patterson is an 69 y.o. female G1P1 h/o HLD, hypothyroidism here for planned hysteroscopy, D&C, possibly polypectomy for postmenopausal bleeding  On 11/16/2019 seen for annual/pap, started spotting after that, had an [place] 12/17 and EMB 12/22. On 12/26 had heavy bleeding that soaked through clothes; was told to stop Prempro and progesterone.  Notes history of D&C, polypectomy and heavy bleeding. No medication/treatment for heavy bleeding. Menopause at late 65s. Starting having symptoms, hot flashes, at 69yo, has been on HRT since then. Patient notes no PMB but occasional spotting at annuals that results in EMB that were benign. Has been on Prempro for 15 years for menopausal symptoms (terrible hot flashes, 24xdays).  Reports bleeding stopped since stopping HRT.   No other concerns.  [place] 12/16/2019 Uterus 9.7x5.2x4.9cm, anteverted, heterogenous myometrium. Posterior mid uterine leiomyoma extending to left, 4x3.9x3.8cm, endometrium 28mm mildly heterogenous. Ovaries not visualized  EMB 12/21/2019 DISORDERED PROLIFERATIVE ENDOMETRIUM.  BENIGN ENDOMETRIAL POLYP.    Past Medical History:  Diagnosis Date   Arthritis    Fluid retention in legs    and hands occ   GERD (gastroesophageal reflux disease)    Heart murmur    slight heart murmur no cardiologist   Hyperlipidemia    Hypothyroidism    Malignant melanoma ([place]) 1999   left shoulder   PMB (postmenopausal bleeding)    PONV (postoperative nausea and vomiting)    nausea   Seasonal allergies     Past Surgical History:  Procedure Laterality Date   BREAST LUMPECTOMY  1997   benign   COLONOSCOPY  2007, 2012 and 2018   OVARIAN CYST REMOVED  1975    Family History  Problem Relation Age of Onset   Heart disease Brother    Cancer Brother    Heart disease Sister    Colon cancer Neg Hx    Stomach cancer Neg Hx     Social History:  reports that she has never smoked. She has never used smokeless tobacco. She reports previous alcohol  use. She reports that she does not use drugs.  Allergies:  Allergies  Allergen Reactions   Codeine Nausea And Vomiting   Demerol Nausea Only   Lyrica [Pregabalin]     "triple vision"   Penicillins     hives    Medications Prior to Admission  Medication Sig Dispense Refill Last Dose   atorvastatin (LIPITOR) 10 MG tablet Take 10 mg by mouth at bedtime.    03/19/2020 at Unknown time   hydrochlorothiazide 50 MG tablet Take 50 mg by mouth daily.     03/19/2020 at Unknown time   NABUMETONE PO Take 750 mg by mouth in the morning and at bedtime.   03/19/2020 at Unknown time   omeprazole (PRILOSEC) 20 MG capsule Take 20 mg by mouth daily.   03/20/2020 at 0920   Polyethylene Glycol 3350 (MIRALAX PO) Take by mouth daily.   Past Week at Unknown time   POTASSIUM CHLORIDE PO Take by mouth. 40 meq in am   03/19/2020 at Unknown time   SYNTHROID 75 MCG tablet Take 75 mcg by mouth daily.  0 03/20/2020 at 0730   Turmeric (QC TUMERIC COMPLEX PO) Take by mouth. 2706 mg 3 tabs daily   03/19/2020 at Unknown time   UNABLE TO FIND Fish oil 2400 mg with vitamin d daily   Past Month at Unknown time   Wheat Dextrin (BENEFIBER PO) Take by mouth daily.   Past Week at Unknown time   famotidine-calcium carbonate-magnesium hydroxide (PEPCID  COMPLETE) [phone] MG CHEW Chew 1 tablet by mouth 2 (two) times daily as needed.     Unknown at Unknown time    Review of Systems - per HPI all other pertinent ROS neg  Blood pressure (!) 159/80, pulse 70, temperature (!) 97.5 F (36.4 C), temperature source Oral, resp. rate 16, height 5\' 6"  (1.676 m), weight 181 lb 4.8 oz (82.2 kg), SpO2 97 %. Physical Exam resting comfortably  Results for orders placed or performed during the hospital encounter of 03/20/20 (from the past 24 hour(s))  Type and screen [facility]     Status: None (Preliminary result)   Collection Time: 03/20/20 11:07 AM  Result Value Ref Range   ABO/RH(D) PENDING    Antibody Screen PENDING     Sample Expiration      03/23/2020,2359 Performed at [facility], [place] [address], [place], [place] [address]   I-STAT, chem 8     Status: Abnormal   Collection Time: 03/20/20 11:13 AM  Result Value Ref Range   Sodium 141 135 - 145 mmol/L   Potassium 3.5 3.5 - 5.1 mmol/L   Chloride 103 98 - 111 mmol/L   BUN 13 8 - 23 mg/dL   Creatinine, Ser 0.70 0.44 - 1.00 mg/dL   Glucose, Bld 91 70 - 99 mg/dL   Calcium, Ion 1.30 1.15 - 1.40 mmol/L   TCO2 28 22 - 32 mmol/L   Hemoglobin 15.3 (H) 12.0 - 15.0 g/dL   HCT 45.0 36.0 - 46.0 %     Assessment/Plan: Debra Patterson 69 y.o. here for Exam under anesthesia, hysteroscopy/D&C/possible myomectomy/polypectomy due to postmenopausal bleeding  The nature of the procedure was discussed with the patient in detail. An informed discussion was held regarding the risks and benefits of surgical intervention. Specifically, the patient was apprised of risks of pain, bleeding requiring blood transfusion, infection requiring antibiotics, uterine perforation, injury to nearby organs (bowel, bladder, nerves, blood vessels, ureter). She was informed of the low but real risk of these complications, and understands that the alternative is no surgery. An opportunity to ask questions was provided, and all questions were answered to the patient's satisfaction. Patient expresses understanding of these issues, and agrees to proceed with the plan outlined above. The expected post-operative recovery course was discussed with the patient, and post-operative instructions were reviewed.  2. Hot flashes, sleep difficulties, grumpy since stopping HRT. Consider restarting HRT postop vs. nonhormonal methods.   3. Comorbidities: patient is very active, bikes 7 miles/day, no SOB or CP. Reports nausea after anesthesia, otherwise no issues. Takes HCTZ for fluid retention not BP. Takes Nabumetone for arthritis, recommend holding evening dose and morning of surgery dose.    Debra Patterson K Taam-Akelman 03/20/2020, 11:43 AM

## 2020-03-20 NOTE — Op Note (Signed)
OPERATIVE NOTE PATIENT:  Debra Patterson  69 y.o. female  PRE-OPERATIVE DIAGNOSIS:  Postmenopausal bleeding  POST-OPERATIVE DIAGNOSIS:  Fibroid, polyps  PROCEDURE:  Procedure(s): HYSTEROSCOPY WITH MYOSURE (N/A) Polypectomy Myomectomy  SURGEON:  Surgeon(s) and Role:    * Taam-Akelman, Lawrence Santiago, MD - Primary  ANESTHESIA:   general  EBL:  25 mL   BLOOD ADMINISTERED:none  DRAINS: none   LOCAL MEDICATIONS USED:  NONE  SPECIMEN:  Source of Specimen:  endometrial polyp, fibroid  DISPOSITION OF SPECIMEN:  PATHOLOGY  COUNTS:  YES  TOURNIQUET:  * No tourniquets in log *  PLAN OF CARE: Discharge to home after PACU  PATIENT DISPOSITION:  PACU - hemodynamically stable.  FINDINGS: Exam under anesthesia revealed small mobile, anteverted uterus. Hysteroscopy revealed two uterine polyps, uterine fibroid, uterine septum otherwise atrophic in appearence. Bilateral ostia visualized. Fluid deficit = 400cc  DESCRIPTION OF PROCEDURE: After consent was verified, the patient was taken to the operating room where general anesthesia was administered without difficulty.  The patient was placed in the dorsal lithotomy position using allen stirups.  An exam under anesthesia revealed a small mobile anteverted uterus. The vagina was then prepped and draped in a normal sterile fashion. An in and out catheterization was performed.   A bivalve speculum was placed in the vagina. The anterior lip of the cervix was grasped with a single tooth tenaculum.  The cervix was sequentially dilated using pratt dilators to a #19. The hysteroscope was inserted into the cervix and advanced into the uterine cavity without difficulty. Hysteroscopy revealed two uterine polyps, uterine fibroid, uterine septum otherwise atrophic in appearence. Bilateral ostia visualized. The fibroid and polyps removed with the Myosure XL.  All instruments were removed from the vagina. Hemostasis was noted at the tenaculum sites  with pressure. The sponge and instrument counts were correct x2. The patient was taken to the PACU in stable condition. Isela Stantz K Taam-Akelman 03/20/20 12:49 PM

## 2020-03-20 NOTE — Brief Op Note (Signed)
03/20/2020  12:48 PM  PATIENT:  Debra Patterson  69 y.o. female  PRE-OPERATIVE DIAGNOSIS:  PMB  POST-OPERATIVE DIAGNOSIS:  PMB  PROCEDURE:  Procedure(s): HYSTEROSCOPY WITH MYOSURE (N/A)  SURGEON:  Surgeon(s) and Role:    * Taam-Akelman, Lawrence Santiago, MD - Primary  ANESTHESIA:   general  EBL:  25 mL   BLOOD ADMINISTERED:none  DRAINS: none   LOCAL MEDICATIONS USED:  NONE  SPECIMEN:  Source of Specimen:  endometrial polyp, fibroid  DISPOSITION OF SPECIMEN:  PATHOLOGY  COUNTS:  YES  TOURNIQUET:  * No tourniquets in log *  DICTATION: .Note written in EPIC  PLAN OF CARE: Discharge to home after PACU  PATIENT DISPOSITION:  PACU - hemodynamically stable.   Delay start of Pharmacological VTE agent (>24hrs) due to surgical blood loss or risk of bleeding: not applicable Erica Richwine K Taam-Akelman 03/20/20 12:49 PM

## 2020-03-21 LAB — SURGICAL PATHOLOGY

## 2020-04-13 DIAGNOSIS — M542 Cervicalgia: Secondary | ICD-10-CM | POA: Diagnosis not present

## 2020-04-13 DIAGNOSIS — M1990 Unspecified osteoarthritis, unspecified site: Secondary | ICD-10-CM | POA: Diagnosis not present

## 2020-04-13 DIAGNOSIS — M7989 Other specified soft tissue disorders: Secondary | ICD-10-CM | POA: Diagnosis not present

## 2020-04-13 DIAGNOSIS — Z6829 Body mass index (BMI) 29.0-29.9, adult: Secondary | ICD-10-CM | POA: Diagnosis not present

## 2020-04-13 DIAGNOSIS — E663 Overweight: Secondary | ICD-10-CM | POA: Diagnosis not present

## 2020-04-13 DIAGNOSIS — M2550 Pain in unspecified joint: Secondary | ICD-10-CM | POA: Diagnosis not present

## 2020-06-21 DIAGNOSIS — Z85828 Personal history of other malignant neoplasm of skin: Secondary | ICD-10-CM | POA: Diagnosis not present

## 2020-06-21 DIAGNOSIS — D2271 Melanocytic nevi of right lower limb, including hip: Secondary | ICD-10-CM | POA: Diagnosis not present

## 2020-06-21 DIAGNOSIS — L821 Other seborrheic keratosis: Secondary | ICD-10-CM | POA: Diagnosis not present

## 2020-06-21 DIAGNOSIS — Z85820 Personal history of malignant melanoma of skin: Secondary | ICD-10-CM | POA: Diagnosis not present

## 2020-06-21 DIAGNOSIS — D1801 Hemangioma of skin and subcutaneous tissue: Secondary | ICD-10-CM | POA: Diagnosis not present

## 2020-06-21 DIAGNOSIS — L570 Actinic keratosis: Secondary | ICD-10-CM | POA: Diagnosis not present

## 2020-06-21 DIAGNOSIS — L820 Inflamed seborrheic keratosis: Secondary | ICD-10-CM | POA: Diagnosis not present

## 2020-06-21 DIAGNOSIS — B078 Other viral warts: Secondary | ICD-10-CM | POA: Diagnosis not present

## 2020-06-21 DIAGNOSIS — L720 Epidermal cyst: Secondary | ICD-10-CM | POA: Diagnosis not present

## 2020-06-21 DIAGNOSIS — D224 Melanocytic nevi of scalp and neck: Secondary | ICD-10-CM | POA: Diagnosis not present

## 2020-06-21 DIAGNOSIS — D485 Neoplasm of uncertain behavior of skin: Secondary | ICD-10-CM | POA: Diagnosis not present

## 2020-06-21 DIAGNOSIS — L918 Other hypertrophic disorders of the skin: Secondary | ICD-10-CM | POA: Diagnosis not present

## 2020-07-05 DIAGNOSIS — M79644 Pain in right finger(s): Secondary | ICD-10-CM | POA: Diagnosis not present

## 2020-07-05 DIAGNOSIS — M1811 Unilateral primary osteoarthritis of first carpometacarpal joint, right hand: Secondary | ICD-10-CM | POA: Diagnosis not present

## 2020-07-05 DIAGNOSIS — M65839 Other synovitis and tenosynovitis, unspecified forearm: Secondary | ICD-10-CM | POA: Diagnosis not present

## 2020-07-05 DIAGNOSIS — M65831 Other synovitis and tenosynovitis, right forearm: Secondary | ICD-10-CM | POA: Diagnosis not present

## 2020-08-23 DIAGNOSIS — H524 Presbyopia: Secondary | ICD-10-CM | POA: Diagnosis not present

## 2020-08-25 DIAGNOSIS — M5412 Radiculopathy, cervical region: Secondary | ICD-10-CM | POA: Diagnosis not present

## 2020-10-03 DIAGNOSIS — Z1231 Encounter for screening mammogram for malignant neoplasm of breast: Secondary | ICD-10-CM | POA: Diagnosis not present

## 2020-10-24 DIAGNOSIS — E039 Hypothyroidism, unspecified: Secondary | ICD-10-CM | POA: Diagnosis not present

## 2020-10-24 DIAGNOSIS — E559 Vitamin D deficiency, unspecified: Secondary | ICD-10-CM | POA: Diagnosis not present

## 2020-10-24 DIAGNOSIS — E785 Hyperlipidemia, unspecified: Secondary | ICD-10-CM | POA: Diagnosis not present

## 2020-10-31 DIAGNOSIS — C439 Malignant melanoma of skin, unspecified: Secondary | ICD-10-CM | POA: Diagnosis not present

## 2020-10-31 DIAGNOSIS — Z0000 Encounter for general adult medical examination without abnormal findings: Secondary | ICD-10-CM | POA: Diagnosis not present

## 2020-10-31 DIAGNOSIS — E669 Obesity, unspecified: Secondary | ICD-10-CM | POA: Diagnosis not present

## 2020-10-31 DIAGNOSIS — I10 Essential (primary) hypertension: Secondary | ICD-10-CM | POA: Diagnosis not present

## 2020-10-31 DIAGNOSIS — E039 Hypothyroidism, unspecified: Secondary | ICD-10-CM | POA: Diagnosis not present

## 2020-10-31 DIAGNOSIS — K760 Fatty (change of) liver, not elsewhere classified: Secondary | ICD-10-CM | POA: Diagnosis not present

## 2020-10-31 DIAGNOSIS — E559 Vitamin D deficiency, unspecified: Secondary | ICD-10-CM | POA: Diagnosis not present

## 2020-10-31 DIAGNOSIS — E785 Hyperlipidemia, unspecified: Secondary | ICD-10-CM | POA: Diagnosis not present

## 2020-10-31 DIAGNOSIS — E042 Nontoxic multinodular goiter: Secondary | ICD-10-CM | POA: Diagnosis not present

## 2020-10-31 DIAGNOSIS — M25531 Pain in right wrist: Secondary | ICD-10-CM | POA: Diagnosis not present

## 2021-01-25 DIAGNOSIS — M722 Plantar fascial fibromatosis: Secondary | ICD-10-CM | POA: Diagnosis not present

## 2021-01-25 DIAGNOSIS — M6702 Short Achilles tendon (acquired), left ankle: Secondary | ICD-10-CM | POA: Diagnosis not present

## 2021-02-13 DIAGNOSIS — M722 Plantar fascial fibromatosis: Secondary | ICD-10-CM | POA: Diagnosis not present

## 2021-02-13 DIAGNOSIS — M6702 Short Achilles tendon (acquired), left ankle: Secondary | ICD-10-CM | POA: Diagnosis not present

## 2021-03-20 DIAGNOSIS — M722 Plantar fascial fibromatosis: Secondary | ICD-10-CM | POA: Diagnosis not present

## 2021-03-20 DIAGNOSIS — M6702 Short Achilles tendon (acquired), left ankle: Secondary | ICD-10-CM | POA: Diagnosis not present

## 2021-05-01 DIAGNOSIS — M6702 Short Achilles tendon (acquired), left ankle: Secondary | ICD-10-CM | POA: Diagnosis not present

## 2021-05-01 DIAGNOSIS — M722 Plantar fascial fibromatosis: Secondary | ICD-10-CM | POA: Diagnosis not present

## 2021-07-04 DIAGNOSIS — H02831 Dermatochalasis of right upper eyelid: Secondary | ICD-10-CM | POA: Diagnosis not present

## 2021-07-04 DIAGNOSIS — H02403 Unspecified ptosis of bilateral eyelids: Secondary | ICD-10-CM | POA: Diagnosis not present

## 2021-07-04 DIAGNOSIS — H02834 Dermatochalasis of left upper eyelid: Secondary | ICD-10-CM | POA: Diagnosis not present

## 2021-08-31 DIAGNOSIS — H02832 Dermatochalasis of right lower eyelid: Secondary | ICD-10-CM | POA: Diagnosis not present

## 2021-08-31 DIAGNOSIS — H02831 Dermatochalasis of right upper eyelid: Secondary | ICD-10-CM | POA: Diagnosis not present

## 2021-08-31 DIAGNOSIS — H02835 Dermatochalasis of left lower eyelid: Secondary | ICD-10-CM | POA: Diagnosis not present

## 2021-08-31 DIAGNOSIS — H02834 Dermatochalasis of left upper eyelid: Secondary | ICD-10-CM | POA: Diagnosis not present

## 2021-08-31 DIAGNOSIS — Z01818 Encounter for other preprocedural examination: Secondary | ICD-10-CM | POA: Diagnosis not present

## 2021-08-31 DIAGNOSIS — Z411 Encounter for cosmetic surgery: Secondary | ICD-10-CM | POA: Diagnosis not present

## 2021-08-31 DIAGNOSIS — H02403 Unspecified ptosis of bilateral eyelids: Secondary | ICD-10-CM | POA: Diagnosis not present

## 2021-08-31 DIAGNOSIS — H53453 Other localized visual field defect, bilateral: Secondary | ICD-10-CM | POA: Diagnosis not present

## 2021-10-23 DIAGNOSIS — Z1231 Encounter for screening mammogram for malignant neoplasm of breast: Secondary | ICD-10-CM | POA: Diagnosis not present

## 2021-11-27 DIAGNOSIS — Z124 Encounter for screening for malignant neoplasm of cervix: Secondary | ICD-10-CM | POA: Diagnosis not present

## 2021-11-27 DIAGNOSIS — Z01419 Encounter for gynecological examination (general) (routine) without abnormal findings: Secondary | ICD-10-CM | POA: Diagnosis not present

## 2021-11-30 DIAGNOSIS — M5412 Radiculopathy, cervical region: Secondary | ICD-10-CM | POA: Diagnosis not present

## 2022-01-04 DIAGNOSIS — N950 Postmenopausal bleeding: Secondary | ICD-10-CM | POA: Diagnosis not present

## 2022-04-09 DIAGNOSIS — K045 Chronic apical periodontitis: Secondary | ICD-10-CM | POA: Diagnosis not present

## 2022-04-09 DIAGNOSIS — K046 Periapical abscess with sinus: Secondary | ICD-10-CM | POA: Diagnosis not present

## 2022-07-01 DIAGNOSIS — L723 Sebaceous cyst: Secondary | ICD-10-CM | POA: Diagnosis not present

## 2022-07-24 DIAGNOSIS — Z85828 Personal history of other malignant neoplasm of skin: Secondary | ICD-10-CM | POA: Diagnosis not present

## 2022-07-24 DIAGNOSIS — L720 Epidermal cyst: Secondary | ICD-10-CM | POA: Diagnosis not present

## 2022-07-24 DIAGNOSIS — Z85820 Personal history of malignant melanoma of skin: Secondary | ICD-10-CM | POA: Diagnosis not present

## 2022-08-07 DIAGNOSIS — I10 Essential (primary) hypertension: Secondary | ICD-10-CM | POA: Diagnosis not present

## 2022-08-07 DIAGNOSIS — R7989 Other specified abnormal findings of blood chemistry: Secondary | ICD-10-CM | POA: Diagnosis not present

## 2022-08-07 DIAGNOSIS — E785 Hyperlipidemia, unspecified: Secondary | ICD-10-CM | POA: Diagnosis not present

## 2022-08-07 DIAGNOSIS — E559 Vitamin D deficiency, unspecified: Secondary | ICD-10-CM | POA: Diagnosis not present

## 2022-08-15 DIAGNOSIS — K760 Fatty (change of) liver, not elsewhere classified: Secondary | ICD-10-CM | POA: Diagnosis not present

## 2022-08-15 DIAGNOSIS — Z1331 Encounter for screening for depression: Secondary | ICD-10-CM | POA: Diagnosis not present

## 2022-08-15 DIAGNOSIS — E559 Vitamin D deficiency, unspecified: Secondary | ICD-10-CM | POA: Diagnosis not present

## 2022-08-15 DIAGNOSIS — Z0000 Encounter for general adult medical examination without abnormal findings: Secondary | ICD-10-CM | POA: Diagnosis not present

## 2022-08-15 DIAGNOSIS — M5030 Other cervical disc degeneration, unspecified cervical region: Secondary | ICD-10-CM | POA: Diagnosis not present

## 2022-08-15 DIAGNOSIS — E669 Obesity, unspecified: Secondary | ICD-10-CM | POA: Diagnosis not present

## 2022-08-15 DIAGNOSIS — C439 Malignant melanoma of skin, unspecified: Secondary | ICD-10-CM | POA: Diagnosis not present

## 2022-08-15 DIAGNOSIS — E039 Hypothyroidism, unspecified: Secondary | ICD-10-CM | POA: Diagnosis not present

## 2022-08-15 DIAGNOSIS — E785 Hyperlipidemia, unspecified: Secondary | ICD-10-CM | POA: Diagnosis not present

## 2022-08-15 DIAGNOSIS — E042 Nontoxic multinodular goiter: Secondary | ICD-10-CM | POA: Diagnosis not present

## 2022-08-15 DIAGNOSIS — Z1389 Encounter for screening for other disorder: Secondary | ICD-10-CM | POA: Diagnosis not present

## 2022-08-15 DIAGNOSIS — I10 Essential (primary) hypertension: Secondary | ICD-10-CM | POA: Diagnosis not present

## 2022-08-19 DIAGNOSIS — D1801 Hemangioma of skin and subcutaneous tissue: Secondary | ICD-10-CM | POA: Diagnosis not present

## 2022-08-19 DIAGNOSIS — D2372 Other benign neoplasm of skin of left lower limb, including hip: Secondary | ICD-10-CM | POA: Diagnosis not present

## 2022-08-19 DIAGNOSIS — L821 Other seborrheic keratosis: Secondary | ICD-10-CM | POA: Diagnosis not present

## 2022-08-19 DIAGNOSIS — L720 Epidermal cyst: Secondary | ICD-10-CM | POA: Diagnosis not present

## 2022-08-19 DIAGNOSIS — D2272 Melanocytic nevi of left lower limb, including hip: Secondary | ICD-10-CM | POA: Diagnosis not present

## 2022-08-19 DIAGNOSIS — D2271 Melanocytic nevi of right lower limb, including hip: Secondary | ICD-10-CM | POA: Diagnosis not present

## 2022-08-19 DIAGNOSIS — D2262 Melanocytic nevi of left upper limb, including shoulder: Secondary | ICD-10-CM | POA: Diagnosis not present

## 2022-08-19 DIAGNOSIS — Z85828 Personal history of other malignant neoplasm of skin: Secondary | ICD-10-CM | POA: Diagnosis not present

## 2022-08-19 DIAGNOSIS — Z85820 Personal history of malignant melanoma of skin: Secondary | ICD-10-CM | POA: Diagnosis not present

## 2022-08-19 DIAGNOSIS — L738 Other specified follicular disorders: Secondary | ICD-10-CM | POA: Diagnosis not present

## 2022-08-19 DIAGNOSIS — L814 Other melanin hyperpigmentation: Secondary | ICD-10-CM | POA: Diagnosis not present

## 2022-09-26 DIAGNOSIS — K219 Gastro-esophageal reflux disease without esophagitis: Secondary | ICD-10-CM | POA: Diagnosis not present

## 2022-09-26 DIAGNOSIS — K5900 Constipation, unspecified: Secondary | ICD-10-CM | POA: Diagnosis not present

## 2022-09-26 DIAGNOSIS — K760 Fatty (change of) liver, not elsewhere classified: Secondary | ICD-10-CM | POA: Diagnosis not present

## 2022-10-22 DIAGNOSIS — Z09 Encounter for follow-up examination after completed treatment for conditions other than malignant neoplasm: Secondary | ICD-10-CM | POA: Diagnosis not present

## 2022-10-22 DIAGNOSIS — Z1211 Encounter for screening for malignant neoplasm of colon: Secondary | ICD-10-CM | POA: Diagnosis not present

## 2022-10-22 DIAGNOSIS — K644 Residual hemorrhoidal skin tags: Secondary | ICD-10-CM | POA: Diagnosis not present

## 2022-10-22 DIAGNOSIS — Z86010 Personal history of colonic polyps: Secondary | ICD-10-CM | POA: Diagnosis not present

## 2022-10-29 DIAGNOSIS — Z1231 Encounter for screening mammogram for malignant neoplasm of breast: Secondary | ICD-10-CM | POA: Diagnosis not present

## 2022-10-29 DIAGNOSIS — H25813 Combined forms of age-related cataract, bilateral: Secondary | ICD-10-CM | POA: Diagnosis not present

## 2022-11-12 DIAGNOSIS — M5412 Radiculopathy, cervical region: Secondary | ICD-10-CM | POA: Diagnosis not present

## 2022-12-20 DIAGNOSIS — M25562 Pain in left knee: Secondary | ICD-10-CM | POA: Diagnosis not present

## 2022-12-31 DIAGNOSIS — Z01419 Encounter for gynecological examination (general) (routine) without abnormal findings: Secondary | ICD-10-CM | POA: Diagnosis not present

## 2023-03-19 ENCOUNTER — Other Ambulatory Visit (HOSPITAL_COMMUNITY): Payer: Self-pay | Admitting: Obstetrics & Gynecology

## 2023-03-19 DIAGNOSIS — N950 Postmenopausal bleeding: Secondary | ICD-10-CM

## 2023-03-25 DIAGNOSIS — M25562 Pain in left knee: Secondary | ICD-10-CM | POA: Diagnosis not present

## 2023-03-31 DIAGNOSIS — H04123 Dry eye syndrome of bilateral lacrimal glands: Secondary | ICD-10-CM | POA: Diagnosis not present

## 2023-03-31 DIAGNOSIS — H02884 Meibomian gland dysfunction left upper eyelid: Secondary | ICD-10-CM | POA: Diagnosis not present

## 2023-03-31 DIAGNOSIS — H02881 Meibomian gland dysfunction right upper eyelid: Secondary | ICD-10-CM | POA: Diagnosis not present

## 2023-04-01 ENCOUNTER — Ambulatory Visit (HOSPITAL_COMMUNITY)
Admission: RE | Admit: 2023-04-01 | Discharge: 2023-04-01 | Disposition: A | Discharge status: Home / Self Care | Payer: PPO | Source: Ambulatory Visit | Attending: Obstetrics & Gynecology | Admitting: Obstetrics & Gynecology

## 2023-04-01 DIAGNOSIS — N950 Postmenopausal bleeding: Secondary | ICD-10-CM | POA: Diagnosis not present

## 2023-04-01 DIAGNOSIS — D259 Leiomyoma of uterus, unspecified: Secondary | ICD-10-CM | POA: Diagnosis not present

## 2023-04-18 DIAGNOSIS — M1712 Unilateral primary osteoarthritis, left knee: Secondary | ICD-10-CM | POA: Diagnosis not present

## 2023-04-25 DIAGNOSIS — M1712 Unilateral primary osteoarthritis, left knee: Secondary | ICD-10-CM | POA: Diagnosis not present

## 2023-04-28 DIAGNOSIS — Z419 Encounter for procedure for purposes other than remedying health state, unspecified: Secondary | ICD-10-CM | POA: Diagnosis not present

## 2023-04-28 DIAGNOSIS — I788 Other diseases of capillaries: Secondary | ICD-10-CM | POA: Diagnosis not present

## 2023-04-28 DIAGNOSIS — Z85828 Personal history of other malignant neoplasm of skin: Secondary | ICD-10-CM | POA: Diagnosis not present

## 2023-04-28 DIAGNOSIS — L821 Other seborrheic keratosis: Secondary | ICD-10-CM | POA: Diagnosis not present

## 2023-04-28 DIAGNOSIS — Z85820 Personal history of malignant melanoma of skin: Secondary | ICD-10-CM | POA: Diagnosis not present

## 2023-04-28 DIAGNOSIS — D485 Neoplasm of uncertain behavior of skin: Secondary | ICD-10-CM | POA: Diagnosis not present

## 2023-04-28 DIAGNOSIS — D2372 Other benign neoplasm of skin of left lower limb, including hip: Secondary | ICD-10-CM | POA: Diagnosis not present

## 2023-04-28 DIAGNOSIS — B078 Other viral warts: Secondary | ICD-10-CM | POA: Diagnosis not present

## 2023-05-02 DIAGNOSIS — M1712 Unilateral primary osteoarthritis, left knee: Secondary | ICD-10-CM | POA: Diagnosis not present

## 2023-06-03 DIAGNOSIS — M1712 Unilateral primary osteoarthritis, left knee: Secondary | ICD-10-CM | POA: Diagnosis not present

## 2023-06-03 DIAGNOSIS — Z79899 Other long term (current) drug therapy: Secondary | ICD-10-CM | POA: Diagnosis not present

## 2023-06-25 DIAGNOSIS — G8929 Other chronic pain: Secondary | ICD-10-CM | POA: Diagnosis not present

## 2023-06-25 DIAGNOSIS — M1991 Primary osteoarthritis, unspecified site: Secondary | ICD-10-CM | POA: Diagnosis not present

## 2023-06-25 DIAGNOSIS — M25562 Pain in left knee: Secondary | ICD-10-CM | POA: Diagnosis not present

## 2023-06-25 DIAGNOSIS — M25462 Effusion, left knee: Secondary | ICD-10-CM | POA: Diagnosis not present

## 2023-06-25 DIAGNOSIS — E669 Obesity, unspecified: Secondary | ICD-10-CM | POA: Diagnosis not present

## 2023-06-25 DIAGNOSIS — R600 Localized edema: Secondary | ICD-10-CM | POA: Diagnosis not present

## 2023-06-25 DIAGNOSIS — Z6831 Body mass index (BMI) 31.0-31.9, adult: Secondary | ICD-10-CM | POA: Diagnosis not present

## 2023-07-01 DIAGNOSIS — M1712 Unilateral primary osteoarthritis, left knee: Secondary | ICD-10-CM | POA: Diagnosis not present

## 2023-07-01 DIAGNOSIS — M65862 Other synovitis and tenosynovitis, left lower leg: Secondary | ICD-10-CM | POA: Diagnosis not present

## 2023-07-01 DIAGNOSIS — M25462 Effusion, left knee: Secondary | ICD-10-CM | POA: Diagnosis not present

## 2023-07-01 DIAGNOSIS — S83232A Complex tear of medial meniscus, current injury, left knee, initial encounter: Secondary | ICD-10-CM | POA: Diagnosis not present

## 2023-07-01 DIAGNOSIS — M7122 Synovial cyst of popliteal space [Baker], left knee: Secondary | ICD-10-CM | POA: Diagnosis not present

## 2023-07-09 DIAGNOSIS — S00251A Superficial foreign body of right eyelid and periocular area, initial encounter: Secondary | ICD-10-CM | POA: Diagnosis not present

## 2023-07-11 DIAGNOSIS — M1712 Unilateral primary osteoarthritis, left knee: Secondary | ICD-10-CM | POA: Diagnosis not present

## 2023-07-15 DIAGNOSIS — E669 Obesity, unspecified: Secondary | ICD-10-CM | POA: Diagnosis not present

## 2023-07-15 DIAGNOSIS — M1991 Primary osteoarthritis, unspecified site: Secondary | ICD-10-CM | POA: Diagnosis not present

## 2023-07-15 DIAGNOSIS — M25462 Effusion, left knee: Secondary | ICD-10-CM | POA: Diagnosis not present

## 2023-07-15 DIAGNOSIS — G8929 Other chronic pain: Secondary | ICD-10-CM | POA: Diagnosis not present

## 2023-07-15 DIAGNOSIS — Z6831 Body mass index (BMI) 31.0-31.9, adult: Secondary | ICD-10-CM | POA: Diagnosis not present

## 2023-07-15 DIAGNOSIS — R600 Localized edema: Secondary | ICD-10-CM | POA: Diagnosis not present

## 2023-07-15 DIAGNOSIS — M25562 Pain in left knee: Secondary | ICD-10-CM | POA: Diagnosis not present

## 2023-07-15 DIAGNOSIS — Z8739 Personal history of other diseases of the musculoskeletal system and connective tissue: Secondary | ICD-10-CM | POA: Diagnosis not present

## 2023-09-09 DIAGNOSIS — M25562 Pain in left knee: Secondary | ICD-10-CM | POA: Diagnosis not present

## 2023-09-09 DIAGNOSIS — M25462 Effusion, left knee: Secondary | ICD-10-CM | POA: Diagnosis not present

## 2023-09-09 DIAGNOSIS — E669 Obesity, unspecified: Secondary | ICD-10-CM | POA: Diagnosis not present

## 2023-09-09 DIAGNOSIS — M1991 Primary osteoarthritis, unspecified site: Secondary | ICD-10-CM | POA: Diagnosis not present

## 2023-09-09 DIAGNOSIS — Z8739 Personal history of other diseases of the musculoskeletal system and connective tissue: Secondary | ICD-10-CM | POA: Diagnosis not present

## 2023-09-09 DIAGNOSIS — G8929 Other chronic pain: Secondary | ICD-10-CM | POA: Diagnosis not present

## 2023-09-09 DIAGNOSIS — Z6831 Body mass index (BMI) 31.0-31.9, adult: Secondary | ICD-10-CM | POA: Diagnosis not present

## 2023-09-09 DIAGNOSIS — R600 Localized edema: Secondary | ICD-10-CM | POA: Diagnosis not present

## 2023-10-23 DIAGNOSIS — M25562 Pain in left knee: Secondary | ICD-10-CM | POA: Diagnosis not present

## 2023-10-31 DIAGNOSIS — Z1231 Encounter for screening mammogram for malignant neoplasm of breast: Secondary | ICD-10-CM | POA: Diagnosis not present

## 2023-11-17 DIAGNOSIS — E042 Nontoxic multinodular goiter: Secondary | ICD-10-CM | POA: Diagnosis not present

## 2023-11-17 DIAGNOSIS — Z79899 Other long term (current) drug therapy: Secondary | ICD-10-CM | POA: Diagnosis not present

## 2023-11-17 DIAGNOSIS — E039 Hypothyroidism, unspecified: Secondary | ICD-10-CM | POA: Diagnosis not present

## 2023-11-17 DIAGNOSIS — I10 Essential (primary) hypertension: Secondary | ICD-10-CM | POA: Diagnosis not present

## 2023-11-17 DIAGNOSIS — E559 Vitamin D deficiency, unspecified: Secondary | ICD-10-CM | POA: Diagnosis not present

## 2023-11-17 DIAGNOSIS — E785 Hyperlipidemia, unspecified: Secondary | ICD-10-CM | POA: Diagnosis not present

## 2023-11-19 DIAGNOSIS — E039 Hypothyroidism, unspecified: Secondary | ICD-10-CM | POA: Diagnosis not present

## 2023-11-19 DIAGNOSIS — M25562 Pain in left knee: Secondary | ICD-10-CM | POA: Diagnosis not present

## 2023-11-19 DIAGNOSIS — E669 Obesity, unspecified: Secondary | ICD-10-CM | POA: Diagnosis not present

## 2023-11-19 DIAGNOSIS — E559 Vitamin D deficiency, unspecified: Secondary | ICD-10-CM | POA: Diagnosis not present

## 2023-11-19 DIAGNOSIS — D126 Benign neoplasm of colon, unspecified: Secondary | ICD-10-CM | POA: Diagnosis not present

## 2023-11-19 DIAGNOSIS — Z1339 Encounter for screening examination for other mental health and behavioral disorders: Secondary | ICD-10-CM | POA: Diagnosis not present

## 2023-11-19 DIAGNOSIS — E785 Hyperlipidemia, unspecified: Secondary | ICD-10-CM | POA: Diagnosis not present

## 2023-11-19 DIAGNOSIS — Z0000 Encounter for general adult medical examination without abnormal findings: Secondary | ICD-10-CM | POA: Diagnosis not present

## 2023-11-19 DIAGNOSIS — Z1331 Encounter for screening for depression: Secondary | ICD-10-CM | POA: Diagnosis not present

## 2023-11-19 DIAGNOSIS — E042 Nontoxic multinodular goiter: Secondary | ICD-10-CM | POA: Diagnosis not present

## 2023-11-19 DIAGNOSIS — I10 Essential (primary) hypertension: Secondary | ICD-10-CM | POA: Diagnosis not present

## 2024-03-29 DIAGNOSIS — H5213 Myopia, bilateral: Secondary | ICD-10-CM | POA: Diagnosis not present

## 2024-03-29 DIAGNOSIS — H25813 Combined forms of age-related cataract, bilateral: Secondary | ICD-10-CM | POA: Diagnosis not present

## 2024-05-06 DIAGNOSIS — M25562 Pain in left knee: Secondary | ICD-10-CM | POA: Diagnosis not present

## 2024-08-04 DIAGNOSIS — R0981 Nasal congestion: Secondary | ICD-10-CM | POA: Diagnosis not present

## 2024-08-04 DIAGNOSIS — R070 Pain in throat: Secondary | ICD-10-CM | POA: Diagnosis not present

## 2024-08-04 DIAGNOSIS — R051 Acute cough: Secondary | ICD-10-CM | POA: Diagnosis not present

## 2024-08-13 DIAGNOSIS — M1712 Unilateral primary osteoarthritis, left knee: Secondary | ICD-10-CM | POA: Diagnosis not present

## 2024-09-29 ENCOUNTER — Other Ambulatory Visit (HOSPITAL_BASED_OUTPATIENT_CLINIC_OR_DEPARTMENT_OTHER): Payer: Self-pay

## 2024-09-29 MED ORDER — NABUMETONE 750 MG PO TABS
750.0000 mg | ORAL_TABLET | Freq: Two times a day (BID) | ORAL | 3 refills | Status: AC
  Filled 2024-09-29 – 2024-11-14 (×2): qty 200, 100d supply, fill #0

## 2024-09-29 MED ORDER — HYDROCHLOROTHIAZIDE 50 MG PO TABS
50.0000 mg | ORAL_TABLET | Freq: Every day | ORAL | 3 refills | Status: AC
  Filled 2024-09-29 – 2024-11-01 (×2): qty 100, 100d supply, fill #0
  Filled 2025-02-04: qty 100, 100d supply, fill #1

## 2024-09-29 MED ORDER — SYNTHROID 75 MCG PO TABS
75.0000 ug | ORAL_TABLET | Freq: Every day | ORAL | 4 refills | Status: AC
  Filled 2024-09-29 – 2024-10-19 (×2): qty 100, 100d supply, fill #0
  Filled 2025-01-23: qty 100, 100d supply, fill #1

## 2024-09-29 MED ORDER — POTASSIUM CHLORIDE CRYS ER 20 MEQ PO TBCR
20.0000 meq | EXTENDED_RELEASE_TABLET | Freq: Two times a day (BID) | ORAL | 3 refills | Status: AC
  Filled 2024-09-29 – 2024-11-14 (×2): qty 200, 100d supply, fill #0

## 2024-09-29 MED ORDER — ATORVASTATIN CALCIUM 10 MG PO TABS
10.0000 mg | ORAL_TABLET | Freq: Every day | ORAL | 3 refills | Status: AC
  Filled 2024-09-29 – 2024-12-07 (×2): qty 100, 100d supply, fill #0

## 2024-10-19 ENCOUNTER — Other Ambulatory Visit: Payer: Self-pay

## 2024-10-19 ENCOUNTER — Other Ambulatory Visit (HOSPITAL_BASED_OUTPATIENT_CLINIC_OR_DEPARTMENT_OTHER): Payer: Self-pay

## 2024-10-20 ENCOUNTER — Other Ambulatory Visit (HOSPITAL_BASED_OUTPATIENT_CLINIC_OR_DEPARTMENT_OTHER): Payer: Self-pay

## 2024-11-01 ENCOUNTER — Other Ambulatory Visit (HOSPITAL_BASED_OUTPATIENT_CLINIC_OR_DEPARTMENT_OTHER): Payer: Self-pay

## 2024-11-01 DIAGNOSIS — Z85820 Personal history of malignant melanoma of skin: Secondary | ICD-10-CM | POA: Diagnosis not present

## 2024-11-01 DIAGNOSIS — D1801 Hemangioma of skin and subcutaneous tissue: Secondary | ICD-10-CM | POA: Diagnosis not present

## 2024-11-01 DIAGNOSIS — L820 Inflamed seborrheic keratosis: Secondary | ICD-10-CM | POA: Diagnosis not present

## 2024-11-01 DIAGNOSIS — L821 Other seborrheic keratosis: Secondary | ICD-10-CM | POA: Diagnosis not present

## 2024-11-01 DIAGNOSIS — L570 Actinic keratosis: Secondary | ICD-10-CM | POA: Diagnosis not present

## 2024-11-01 DIAGNOSIS — Z85828 Personal history of other malignant neoplasm of skin: Secondary | ICD-10-CM | POA: Diagnosis not present

## 2024-11-05 DIAGNOSIS — Z1231 Encounter for screening mammogram for malignant neoplasm of breast: Secondary | ICD-10-CM | POA: Diagnosis not present

## 2024-11-09 DIAGNOSIS — Z85828 Personal history of other malignant neoplasm of skin: Secondary | ICD-10-CM | POA: Diagnosis not present

## 2024-11-09 DIAGNOSIS — L650 Telogen effluvium: Secondary | ICD-10-CM | POA: Diagnosis not present

## 2024-11-09 DIAGNOSIS — Z85820 Personal history of malignant melanoma of skin: Secondary | ICD-10-CM | POA: Diagnosis not present

## 2024-11-15 ENCOUNTER — Other Ambulatory Visit (HOSPITAL_BASED_OUTPATIENT_CLINIC_OR_DEPARTMENT_OTHER): Payer: Self-pay

## 2024-11-16 DIAGNOSIS — M1712 Unilateral primary osteoarthritis, left knee: Secondary | ICD-10-CM | POA: Diagnosis not present

## 2024-11-19 DIAGNOSIS — R34 Anuria and oliguria: Secondary | ICD-10-CM | POA: Diagnosis not present

## 2024-12-07 ENCOUNTER — Other Ambulatory Visit (HOSPITAL_BASED_OUTPATIENT_CLINIC_OR_DEPARTMENT_OTHER): Payer: Self-pay

## 2024-12-08 ENCOUNTER — Other Ambulatory Visit (HOSPITAL_BASED_OUTPATIENT_CLINIC_OR_DEPARTMENT_OTHER): Payer: Self-pay

## 2024-12-09 ENCOUNTER — Other Ambulatory Visit: Payer: Self-pay

## 2025-01-24 ENCOUNTER — Other Ambulatory Visit (HOSPITAL_BASED_OUTPATIENT_CLINIC_OR_DEPARTMENT_OTHER): Payer: Self-pay

## 2025-02-04 ENCOUNTER — Other Ambulatory Visit (HOSPITAL_BASED_OUTPATIENT_CLINIC_OR_DEPARTMENT_OTHER): Payer: Self-pay
